# Patient Record
Sex: Male | Born: 2000
Health system: Southern US, Community
[De-identification: ages and names within clinical notes are randomized; demographics above are authoritative.]

## PROBLEM LIST (undated history)

## (undated) DIAGNOSIS — J45909 Unspecified asthma, uncomplicated: Secondary | ICD-10-CM

## (undated) HISTORY — DX: Unspecified asthma, uncomplicated: J45.909

---
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
## 2018-03-27 DIAGNOSIS — Z23 Encounter for immunization: Secondary | ICD-10-CM | POA: Diagnosis not present

## 2018-07-05 DIAGNOSIS — R6883 Chills (without fever): Secondary | ICD-10-CM | POA: Diagnosis not present

## 2018-07-05 DIAGNOSIS — M25561 Pain in right knee: Secondary | ICD-10-CM | POA: Diagnosis not present

## 2018-07-05 DIAGNOSIS — R040 Epistaxis: Secondary | ICD-10-CM | POA: Diagnosis not present

## 2018-07-05 DIAGNOSIS — R110 Nausea: Secondary | ICD-10-CM | POA: Diagnosis not present

## 2018-07-05 DIAGNOSIS — K529 Noninfective gastroenteritis and colitis, unspecified: Secondary | ICD-10-CM | POA: Diagnosis not present

## 2018-07-07 ENCOUNTER — Emergency Department (HOSPITAL_COMMUNITY): Payer: 59

## 2018-07-07 ENCOUNTER — Encounter (HOSPITAL_COMMUNITY): Payer: Self-pay | Admitting: Emergency Medicine

## 2018-07-07 ENCOUNTER — Emergency Department (HOSPITAL_COMMUNITY)
Admission: EM | Admit: 2018-07-07 | Discharge: 2018-07-07 | Disposition: A | Discharge status: Home / Self Care | Payer: 59 | Attending: Emergency Medicine | Admitting: Emergency Medicine

## 2018-07-07 ENCOUNTER — Other Ambulatory Visit: Payer: Self-pay

## 2018-07-07 DIAGNOSIS — J45909 Unspecified asthma, uncomplicated: Secondary | ICD-10-CM | POA: Insufficient documentation

## 2018-07-07 DIAGNOSIS — R197 Diarrhea, unspecified: Secondary | ICD-10-CM | POA: Diagnosis not present

## 2018-07-07 DIAGNOSIS — K5900 Constipation, unspecified: Secondary | ICD-10-CM | POA: Diagnosis not present

## 2018-07-07 DIAGNOSIS — R42 Dizziness and giddiness: Secondary | ICD-10-CM | POA: Insufficient documentation

## 2018-07-07 LAB — COMPREHENSIVE METABOLIC PANEL
ALT: 22 U/L (ref 0–44)
ANION GAP: 9 (ref 5–15)
AST: 37 U/L (ref 15–41)
Albumin: 4.2 g/dL (ref 3.5–5.0)
Alkaline Phosphatase: 81 U/L (ref 38–126)
BUN: 14 mg/dL (ref 6–20)
CHLORIDE: 105 mmol/L (ref 98–111)
CO2: 25 mmol/L (ref 22–32)
Calcium: 8.8 mg/dL — ABNORMAL LOW (ref 8.9–10.3)
Creatinine, Ser: 0.78 mg/dL (ref 0.61–1.24)
GFR calc Af Amer: >60 mL/min (ref >60)
GFR calc non Af Amer: >60 mL/min (ref >60)
Glucose, Bld: 121 mg/dL — ABNORMAL HIGH (ref 70–99)
Potassium: 4.1 mmol/L (ref 3.5–5.1)
Sodium: 139 mmol/L (ref 135–145)
Total Bilirubin: 0.8 mg/dL (ref 0.3–1.2)
Total Protein: 7.7 g/dL (ref 6.5–8.1)

## 2018-07-07 LAB — URINALYSIS, ROUTINE W REFLEX MICROSCOPIC
Bilirubin Urine: NEGATIVE
Glucose, UA: NEGATIVE mg/dL
Hgb urine dipstick: NEGATIVE
Ketones, ur: NEGATIVE mg/dL
Leukocytes,Ua: NEGATIVE
Nitrite: NEGATIVE
PH: 6 (ref 5.0–8.0)
Protein, ur: NEGATIVE mg/dL
Specific Gravity, Urine: 1.021 (ref 1.005–1.030)

## 2018-07-07 LAB — LIPASE, BLOOD: Lipase: 31 U/L (ref 11–51)

## 2018-07-07 LAB — CBC
HCT: 45.9 % (ref 39.0–52.0)
Hemoglobin: 14.9 g/dL (ref 13.0–17.0)
MCH: 31 pg (ref 26.0–34.0)
MCHC: 32.5 g/dL (ref 30.0–36.0)
MCV: 95.4 fL (ref 80.0–100.0)
Platelets: 181 10*3/uL (ref 150–400)
RBC: 4.81 MIL/uL (ref 4.22–5.81)
RDW: 11.8 % (ref 11.5–15.5)
WBC: 7 10*3/uL (ref 4.0–10.5)
nRBC: 0 % (ref 0.0–0.2)

## 2018-07-07 NOTE — ED Provider Notes (Signed)
[facility] EMERGENCY DEPARTMENT Provider Note   CSN: [id number] Arrival date & time: 07/07/18  1616    History   Chief Complaint Chief Complaint  Patient presents with  . Diarrhea    HPI Randy Armstrong is a 18 y.o. male.     Dizziness, diarrhea for several days.  Patient was seen at a local urgent care center on Thursday.  Flu swab at that time was negative.  Apparently his blood pressure was on the low side.  He is normally healthy.  No fever, sweats, chills, neuro deficits, chest pain, dyspnea.  He was prescribed Antivert for his dizziness.  Social history: Consulting civil engineer at [employer].      History reviewed. No pertinent past medical history.  Patient Active Problem List   Diagnosis Date Noted  . FEVER, RECURRENT 02/16/2009  . ASTHMA 11/13/2008  . ANKLE SPRAIN, RIGHT 11/13/2008    History reviewed. No pertinent surgical history.      Home Medications    Prior to Admission medications   Medication Sig Start Date End Date Taking? Authorizing Provider  meclizine (ANTIVERT) 12.5 MG tablet Take 1 tablet by mouth 3 (three) times daily. 07/05/18 07/10/18 Yes [provider]  Multiple Vitamins-Minerals (MULTIVITAMIN ADULTS) TABS Take 1 tablet by mouth daily.   Yes [provider]  predniSONE (DELTASONE) 10 MG tablet Take 1 tablet by mouth 2 (two) times daily. 07/05/18 07/10/18 Yes [provider]  trimethoprim-polymyxin b (POLYTRIM) ophthalmic solution 2 drop in affected eye(s) every 4 hours (while awake) x 7 days 04/03/14  Yes Lajean Manes, MD    Family History History reviewed. No pertinent family history.  Social History Social History   Tobacco Use  . Smoking status: Never Smoker  Substance Use Topics  . Alcohol use: No  . Drug use: Not Currently     Allergies   Diphenhydramine hcl   Review of Systems Review of Systems  All other systems reviewed and are negative.    Physical Exam Updated Vital Signs BP 107/82   Pulse 62   Temp  (!) 97.1 F (36.2 C) (Temporal)   Resp 18   Ht 6\' 3"  (1.905 m)   Wt 79.4 kg   SpO2 94%   BMI 21.87 kg/m   Physical Exam Vitals signs and nursing note reviewed.  Constitutional:      Appearance: He is well-developed.  HENT:     Head: Normocephalic and atraumatic.  Eyes:     Conjunctiva/sclera: Conjunctivae normal.  Neck:     Musculoskeletal: Neck supple.  Cardiovascular:     Rate and Rhythm: Normal rate and regular rhythm.  Pulmonary:     Effort: Pulmonary effort is normal.     Breath sounds: Normal breath sounds.  Abdominal:     General: Bowel sounds are normal.     Palpations: Abdomen is soft.  Musculoskeletal: Normal range of motion.  Skin:    General: Skin is warm and dry.  Neurological:     Mental Status: He is alert and oriented to person, place, and time.  Psychiatric:        Behavior: Behavior normal.      ED Treatments / Results  Labs (all labs ordered are listed, but only abnormal results are displayed) Labs Reviewed  COMPREHENSIVE METABOLIC PANEL - Abnormal; Notable for the following components:      Result Value   Glucose, Bld 121 (*)    Calcium 8.8 (*)    All other components within normal limits  LIPASE, BLOOD  CBC  URINALYSIS, ROUTINE W REFLEX MICROSCOPIC    EKG None  Radiology Dg Abdomen 1 View  Result Date: 07/07/2018 CLINICAL DATA:  Constipation.  Flu-like symptoms. EXAM: ABDOMEN - 1 VIEW COMPARISON:  CT 09/19/2011 FINDINGS: There is a non obstructive bowel gas pattern. No supine evidence of free air. No organomegaly or suspicious calcification. No acute bony abnormality. No significant increase in stool burden. IMPRESSION: Negative. Electronically Signed   By: Charlett Nose M.D.   On: 07/07/2018 19:20    Procedures Procedures (including critical care time)  Medications Ordered in ED Medications - No data to display   Initial Impression / Assessment and Plan / ED Course  I have reviewed the triage vital signs and the nursing  notes.  Pertinent labs & imaging results that were available during my care of the patient were reviewed by me and considered in my medical decision making (see chart for details).        Patient presents with dizziness and diarrhea.  Physical exam normal.  Vital signs acceptable.  Screening labs reveal glucose of 121.  Urinalysis and KUB normal.  Fluids, Antivert 25 mg as needed for dizziness.  Final Clinical Impressions(s) / ED Diagnoses   Final diagnoses:  Dizziness    ED Discharge Orders    None       Donnetta Hutching, MD 07/07/18 [phone]

## 2018-07-07 NOTE — ED Triage Notes (Signed)
Pt was seen at urgent care for flu-like sx, flu swab was negative. Sent her d/t low BP and D/ since Tuesday.

## 2018-07-07 NOTE — Discharge Instructions (Addendum)
Your glucose or sugar is slightly elevated at 121.  This will need to be rechecked.  Otherwise tests were good.  Recommend taking the medication for dizziness.  Increase fluids.  Rest.

## 2018-07-20 DIAGNOSIS — J309 Allergic rhinitis, unspecified: Secondary | ICD-10-CM | POA: Diagnosis not present

## 2018-08-17 DIAGNOSIS — R0781 Pleurodynia: Secondary | ICD-10-CM | POA: Diagnosis not present

## 2018-08-17 DIAGNOSIS — S30811A Abrasion of abdominal wall, initial encounter: Secondary | ICD-10-CM | POA: Diagnosis not present

## 2018-08-17 DIAGNOSIS — Z23 Encounter for immunization: Secondary | ICD-10-CM | POA: Diagnosis not present

## 2018-08-17 DIAGNOSIS — S301XXA Contusion of abdominal wall, initial encounter: Secondary | ICD-10-CM | POA: Diagnosis not present

## 2018-08-17 DIAGNOSIS — X500XXA Overexertion from strenuous movement or load, initial encounter: Secondary | ICD-10-CM | POA: Diagnosis not present

## 2019-03-03 DIAGNOSIS — G44209 Tension-type headache, unspecified, not intractable: Secondary | ICD-10-CM | POA: Diagnosis not present

## 2019-03-03 DIAGNOSIS — J020 Streptococcal pharyngitis: Secondary | ICD-10-CM | POA: Diagnosis not present

## 2019-03-03 DIAGNOSIS — J029 Acute pharyngitis, unspecified: Secondary | ICD-10-CM | POA: Diagnosis not present

## 2019-03-26 ENCOUNTER — Ambulatory Visit: Payer: 59 | Admitting: Family Medicine

## 2019-04-22 ENCOUNTER — Other Ambulatory Visit: Payer: Self-pay

## 2019-04-22 ENCOUNTER — Encounter: Payer: Self-pay | Admitting: Family Medicine

## 2019-04-22 ENCOUNTER — Ambulatory Visit: Payer: 59 | Admitting: Family Medicine

## 2019-04-22 VITALS — BP 118/78 | HR 87 | Temp 98.3°F | Ht 75.0 in | Wt 175.0 lb

## 2019-04-22 DIAGNOSIS — Z23 Encounter for immunization: Secondary | ICD-10-CM | POA: Diagnosis not present

## 2019-04-22 DIAGNOSIS — J301 Allergic rhinitis due to pollen: Secondary | ICD-10-CM | POA: Diagnosis not present

## 2019-04-22 DIAGNOSIS — Z0000 Encounter for general adult medical examination without abnormal findings: Secondary | ICD-10-CM | POA: Diagnosis not present

## 2019-04-22 DIAGNOSIS — L700 Acne vulgaris: Secondary | ICD-10-CM

## 2019-04-22 DIAGNOSIS — J4520 Mild intermittent asthma, uncomplicated: Secondary | ICD-10-CM | POA: Insufficient documentation

## 2019-04-22 HISTORY — DX: Allergic rhinitis due to pollen: J30.1

## 2019-04-22 MED ORDER — ALBUTEROL SULFATE HFA 108 (90 BASE) MCG/ACT IN AERS: 2.0000 | INHALATION_SPRAY | RESPIRATORY_TRACT | 0 refills | Status: DC | PRN

## 2019-04-22 MED ORDER — DOXYCYCLINE HYCLATE 100 MG PO TABS: 100.0000 mg | ORAL_TABLET | Freq: Two times a day (BID) | ORAL | 0 refills | Status: AC

## 2019-04-22 MED ORDER — ADAPALENE-BENZOYL PEROXIDE 0.1-2.5 % EX GEL: CUTANEOUS | 5 refills | Status: DC

## 2019-04-22 NOTE — Progress Notes (Signed)
Subjective  Chief Complaint  Patient presents with  . Establish Care  . Annual Exam  . Acne   HPI: Randy Armstrong is a 18 y.o. male who presents to [facility] [place] today for a Male Wellness Visit.   Wellness Visit: annual visit with health maintenance review and exam , establish care visit. I reviewed old records and chart. imm record is up to date.    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 Happy and healthy. Lives in [place] with his parents. 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 No HR behaviors identified. Hobbies: rollerskating.social: not dating, good friends. Nonsmoker   H/o childhood asthma. Rare exacerbation with URIs. Never hospitalized. No exercise induced sxs.   Worsening acne; describes pustular acne with red painful sores; breakouts are every 2-3 weeks although has chronic sxs. Uses ceravue face wash and benzoyl peroxide to keep it controlled but still bothersome. Mainly in temporal areas and forehead. None on torso.   Lifestyle: Body mass index is 21.88 kg/m. Wt Readings from Last 3 Encounters:  04/22/19 175 lb 0.7 oz (79.4 kg)  07/07/18 175 lb (79.4 kg) (76 %, Z= 0.71)*  04/03/14 144 lb (65.3 kg) (71 %, Z= 0.55)*   * Growth percentiles are based on CDC (Boys, 2-20 Years) data.    Patient Active Problem List   Diagnosis Date Noted  . Seasonal allergic rhinitis due to pollen 04/22/2019  . Childhood asthma, mild intermittent, uncomplicated [birth year]02[birth year]711  . ASTHMA 11/13/2008   Health Maintenance  Topic Date Due  . HIV Screening  08/31/2013  . TETANUS/TDAP  12/24/2019  . INFLUENZA VACCINE  Completed   Immunization History  Administered Date(s) Administered  . DTaP 11/04/1998, 01/04/1999, 03/09/1999, 12/02/1999, 11/13/2003  . HPV 9-valent 08/25/2016, 03/27/2018  . Hepatitis A, Ped/Adol-2 Dose 05/18/2016  . Hepatitis B, ped/adol 11/04/1998, 01/04/1999, 06/08/1999  . HiB  (PRP-OMP) 11/04/1998, 01/04/1999, 03/09/1999, 12/02/1999  . IPV 11/04/1998, 01/04/1999, 12/02/1999, 11/13/2003  . Influenza, Seasonal, Injecte, Preservative Fre 05/18/2016  . Influenza,inj,Quad PF,6+ Mos 04/22/2019  . Influenza-Unspecified 03/27/2018, 03/27/2018  . MMR 09/07/1999, 11/13/2003  . Meningococcal Conjugate 12/23/2009  . Pneumococcal-Unspecified 09/07/1999, 12/02/1999  . Td 12/23/2009  . Tdap 09/07/1999, 12/23/2009  . Varicella 09/07/1999, 12/23/2009   We updated and reviewed the patient's past history in detail and it is documented below. Allergies: Patient is allergic to diphenhydramine hcl. Past Medical History  has a past medical history of Asthma and Seasonal allergic rhinitis due to pollen (04/22/2019). Past Surgical History  has no past surgical history on file. Social History Patient  reports that he has never smoked. He has never used smokeless tobacco. He reports previous drug use. He reports that he does not drink alcohol. Family History Patient family history includes Arthritis in his maternal grandmother and paternal grandmother; Cancer in his mother; Hypertension in his maternal grandmother and paternal grandmother. Review of Systems: Constitutional: negative for fever or malaise Ophthalmic: negative for photophobia, double vision or loss of vision Cardiovascular: negative for chest pain, dyspnea on exertion, or new LE swelling Respiratory: negative for SOB or persistent cough Gastrointestinal: negative for abdominal pain, change in bowel habits or melena Genitourinary: negative for dysuria or gross hematuria Musculoskeletal: negative for new gait disturbance or muscular weakness Integumentary: negative for new or persistent rashes, no breast lumps Neurological: negative for TIA or stroke symptoms Psychiatric: negative for SI or delusions Allergic/Immunologic: negative for hives  Patient Care Team  Relationship Specialty Notifications Start End  Leamon Arnt, MD PCP - General Family Medicine  04/22/19    Objective  Vitals: BP 118/78 (BP Location: Left Arm, Patient Position: Sitting, Cuff Size: Normal)   Pulse 87   Temp 98.3 F (36.8 C) (Temporal)   Ht 6' 3"  (1.905 m)   Wt 175 lb 0.7 oz (79.4 kg)   SpO2 97%   BMI 21.88 kg/m  General:  Well developed, well nourished, no acute distress  Psych:  Alert and orientedx3,normal mood and affect HEENT:  Normocephalic, atraumatic, non-icteric sclera, PERRL, oropharynx is clear without mass or exudate, supple neck without adenopathy, mass or thyromegaly Cardiovascular:  Normal S1, S2, RRR without gallop, rub or murmur, nondisplaced PMI, +2 distal pulses in bilateral upper and lower extremities. Respiratory:  Good breath sounds bilaterally, CTAB with normal respiratory effort Gastrointestinal: normal bowel sounds, soft, non-tender, no noted masses. No HSM MSK: no deformities, contusions. Joints are without erythema or swelling. Spine and CVA region are nontender Skin:  Warm, no rashes or suspicious lesions noted, pustular acne on face Neurologic:    Mental status is normal. CN 2-11 are normal. Gross motor and sensory exams are normal. Stable gait. No tremor GU: No inguinal hernias or adenopathy are appreciated bilaterally  Assessment  1. Annual physical exam   2. Seasonal allergic rhinitis due to pollen   3. Childhood asthma, mild intermittent, uncomplicated   4. Pustular acne   5. Need for immunization against influenza      Plan  Male Wellness Visit:  Age appropriate Health Maintenance and Prevention measures were discussed with patient. Included topics are cancer screening recommendations, ways to keep healthy (see AVS) including dietary and exercise recommendations, regular eye and dental care, use of seat belts, and avoidance of moderate alcohol use and tobacco use.   BMI: discussed patient's BMI and encouraged positive lifestyle modifications to help get to or maintain a target BMI.   HM needs and immunizations were addressed and ordered. See below for orders. See HM and immunization section for updates.  Routine labs and screening tests ordered including cmp, cbc and lipids where appropriate.  Discussed recommendations regarding Vit D and calcium supplementation (see AVS)  Acne: start epiduo and single course of doxy. F/u if not improving. Discussed skin care.  H/o ashthma, now more WARI: discussed prn albuterol. F/u if needed. Flu shot today.  Follow up: Return in about 1 year (around 04/21/2020) for complete physical.   Commons side effects, risks, benefits, and alternatives for medications and treatment plan prescribed today were discussed, and the patient expressed understanding of the given instructions. Patient is instructed to call or message via MyChart if he/she has any questions or concerns regarding our treatment plan. No barriers to understanding were identified. We discussed Red Flag symptoms and signs in detail. Patient expressed understanding regarding what to do in case of urgent or emergency type symptoms.   Medication list was reconciled, printed and provided to the patient in AVS. Patient instructions and summary information was reviewed with the patient as documented in the AVS. This note was prepared with assistance of Dragon voice recognition software. Occasional wrong-word or sound-a-like substitutions may have occurred due to the inherent limitations of voice recognition software This visit occurred during the SARS-CoV-2 public health emergency.  Safety protocols were in place, including screening questions prior to the visit, additional usage of staff PPE, and extensive cleaning of exam room while observing appropriate contact time as indicated for disinfecting solutions.  Orders Placed This Encounter  Procedures  . Flu Vaccine QUAD 36+ mos IM   Meds ordered this encounter  Medications  . Adapalene-Benzoyl Peroxide 0.1-2.5 % gel    Sig: Apply  to face nightly    Dispense:  45 g    Refill:  5  . doxycycline (VIBRA-TABS) 100 MG tablet    Sig: Take 1 tablet (100 mg total) by mouth 2 (two) times daily for 7 days.    Dispense:  14 tablet    Refill:  0  . albuterol (VENTOLIN HFA) 108 (90 Base) MCG/ACT inhaler    Sig: Inhale 2 puffs into the lungs every 4 (four) hours as needed for wheezing or shortness of breath.    Dispense:  18 g    Refill:  0

## 2019-04-22 NOTE — Patient Instructions (Signed)
Please return in 12 months for your annual complete physical; please come fasting. Sooner if your acne is not improving.   Today you were given your flu vaccination.    It was a pleasure meeting you today! Thank you for choosing [place] to meet your healthcare needs! I truly look forward to working with you. If you have any questions or concerns, please send me a message via Mychart or call the office at [phone].   Acne  Acne is a skin problem that causes pimples and other skin changes. The skin has many tiny openings called pores. Each pore contains an oil gland. Oil glands make an oily substance that is called sebum. Acne occurs when the pores in the skin get blocked. The pores may become infected with bacteria, or they may become red, sore, and swollen. Acne is a common skin problem, especially for teenagers. It often occurs on the face, neck, chest, upper arms, and back. Acne usually goes away over time. What are the causes? Acne is caused when oil glands get blocked with sebum, dead skin cells, and dirt. The bacteria that are normally found in the oil glands then multiply and cause inflammation. Acne is commonly triggered by changes in your hormones. These hormonal changes can cause the oil glands to get bigger and to make more sebum. Factors that can make acne worse include:  Hormone changes during: ? Adolescence. ? Women's menstrual cycles. ? Pregnancy.  Oil-based cosmetics and hair products.  Stress.  Hormone problems that are caused by certain diseases.  Certain medicines.  Pressure from headbands, backpacks, or shoulder pads.  Exposure to certain oils and chemicals.  Eating a diet high in carbohydrates that quickly turn to sugar. These include dairy products, desserts, and chocolates. What increases the risk? This condition is more likely to develop in:  Teenagers.  People who have a family history of acne. What are the signs or symptoms? Symptoms include:  Small,  red bumps (pimples or papules).  Whiteheads.  Blackheads.  Small, pus-filled pimples (pustules).  Big, red pimples or pustules that feel tender. More severe acne can cause:  An abscess. This is an infected area that contains a collection of pus.  Cysts. These are hard, painful, fluid-filled sacs.  Scars. These can happen after large pimples heal. How is this diagnosed? This condition is diagnosed with a medical history and physical exam. Blood tests may also be done. How is this treated? Treatment for this condition can vary depending on the severity of your acne. Treatment may include:  Creams and lotions that prevent oil glands from clogging.  Creams and lotions that treat or prevent infections and inflammation.  Antibiotic medicines that are applied to the skin or taken as a pill.  Pills that decrease sebum production.  Birth control pills.  Light or laser treatments.  Injections of medicine into the affected areas.  Chemicals that cause peeling of the skin.  Surgery. Your health care provider will also recommend the best way to take care of your skin. Good skin care is the most important part of treatment. Follow these instructions at home: Skin care Take care of your skin as told by your health care provider. You may be told to do these things:  Wash your skin gently at least two times each day, as well as: ? After you exercise. ? Before you go to bed.  Use mild soap.  Apply a water-based skin moisturizer after you wash your skin.  Use a sunscreen or  sunblock with SPF 30 or greater. This is especially important if you are using acne medicines.  Choose cosmetics that will not block your oil glands (are noncomedogenic). Medicines  Take over-the-counter and prescription medicines only as told by your health care provider.  If you were prescribed an antibiotic medicine, apply it or take it as told by your health care provider. Do not stop using the  antibiotic even if your condition improves. General instructions  Keep your hair clean and off your face. If you have oily hair, shampoo your hair regularly or daily.  Avoid wearing tight headbands or hats.  Avoid picking or squeezing your pimples. That can make your acne worse and cause scarring.  Shave gently and only when necessary.  Keep a food journal to figure out if any foods are linked to your acne. Avoid dairy products, desserts, and chocolates.  Take steps to manage and reduce stress.  Keep all follow-up visits as told by your health care provider. This is important. Contact a health care provider if:  Your acne is not better after eight weeks.  Your acne gets worse.  You have a large area of skin that is red or tender.  You think that you are having side effects from any acne medicine. Summary  Acne is a skin problem that causes pimples and other skin changes. Acne is a common skin problem, especially for teenagers. Acne usually goes away over time.  Acne is commonly triggered by changes in your hormones. There are many other causes, such as stress, diet, and certain medicines.  Follow your health care provider's instructions for how to take care of your skin. Good skin care is the most important part of treatment.  Take over-the-counter and prescription medicines only as told by your health care provider.  Contact your health care provider if you think that you are having side effects from any acne medicine. This information is not intended to replace advice given to you by your health care provider. Make sure you discuss any questions you have with your health care provider. Document Released: 04/29/2000 Document Revised: 09/12/2017 Document Reviewed: 09/12/2017 Elsevier Patient Education  2020 [employer].

## 2019-05-16 ENCOUNTER — Encounter: Payer: Self-pay | Admitting: Family Medicine

## 2019-05-16 ENCOUNTER — Other Ambulatory Visit: Payer: Self-pay | Admitting: Family Medicine

## 2019-05-20 NOTE — Telephone Encounter (Signed)
Please advise 

## 2019-05-20 NOTE — Telephone Encounter (Signed)
  LAST APPOINTMENT DATE: 04/21/2020  NEXT APPOINTMENT DATE:04/22/2020  MEDICATION: Doxycycline 100 MG Tabs  PHARMACY: [facility]- [place], [place]- [address]   LAST REFILL: 04/22/2019  QTY: 14  REFILLS: 0    OTHER COMMENTS:    Okay for refill?  Please advise

## 2019-05-22 DIAGNOSIS — Z20828 Contact with and (suspected) exposure to other viral communicable diseases: Secondary | ICD-10-CM | POA: Diagnosis not present

## 2019-07-03 ENCOUNTER — Encounter: Payer: Self-pay | Admitting: Family Medicine

## 2019-07-03 ENCOUNTER — Ambulatory Visit (INDEPENDENT_AMBULATORY_CARE_PROVIDER_SITE_OTHER): Payer: 59 | Admitting: Family Medicine

## 2019-07-03 ENCOUNTER — Other Ambulatory Visit: Payer: Self-pay

## 2019-07-03 VITALS — BP 110/62 | HR 77 | Temp 97.5°F | Ht 75.0 in | Wt 181.2 lb

## 2019-07-03 DIAGNOSIS — L700 Acne vulgaris: Secondary | ICD-10-CM | POA: Diagnosis not present

## 2019-07-03 MED ORDER — DOXYCYCLINE HYCLATE 100 MG PO TABS: 100.0000 mg | ORAL_TABLET | Freq: Two times a day (BID) | ORAL | 2 refills | Status: AC

## 2019-07-03 NOTE — Progress Notes (Signed)
Subjective  CC:  Chief Complaint  Patient presents with  . Acne    abnormally large acne bump on right side of forehead. about a week. bump is tender and increasing in size    HPI: Randy Armstrong is a 19 y.o. male who presents to the office today to address the problems listed above in the chief complaint.  In December started on epiduo after one course of doxy for pustular acne. Reports improvement. Since, his acne has been well controlled until 2 days ago: now with large tender pustule on right temple. No associated fevers or drainage. Remainder of skin is fairly clear.   Assessment  1. Pustular acne      Plan   acne:  Doxy x 7 days. Will treat as needed for flares. IF becomes more frequent, then would suppress with daily minocin. Continue epiduo.   Follow up: prn  Visit date not found  No orders of the defined types were placed in this encounter.  No orders of the defined types were placed in this encounter.     I reviewed the patients updated PMH, FH, and SocHx.    Patient Active Problem List   Diagnosis Date Noted  . Pustular acne 07/03/2019  . Seasonal allergic rhinitis due to pollen 04/22/2019  . Childhood asthma, mild intermittent, uncomplicated 04/22/2019  . ASTHMA 11/13/2008   Current Meds  Medication Sig  . Adapalene-Benzoyl Peroxide 0.1-2.5 % gel Apply to face nightly  . albuterol (VENTOLIN HFA) 108 (90 Base) MCG/ACT inhaler Inhale 2 puffs into the lungs every 4 (four) hours as needed for wheezing or shortness of breath.  . Multiple Vitamins-Minerals (MULTIVITAMIN ADULTS) TABS Take 1 tablet by mouth daily.    Allergies: Patient is allergic to diphenhydramine hcl. Family History: Patient family history includes Arthritis in his maternal grandmother and paternal grandmother; Cancer in his mother; Hypertension in his maternal grandmother and paternal grandmother. Social History:  Patient  reports that he has never smoked. He has never used smokeless  tobacco. He reports previous drug use. He reports that he does not drink alcohol.  Review of Systems: Constitutional: Negative for fever malaise or anorexia Cardiovascular: negative for chest pain Respiratory: negative for SOB or persistent cough Gastrointestinal: negative for abdominal pain  Objective  Vitals: BP 110/62 (BP Location: Left Arm, Patient Position: Sitting, Cuff Size: Normal)   Pulse 77   Temp (!) 97.5 F (36.4 C) (Temporal)   Ht 6\' 3"  (1.905 m)   Wt 181 lb 3.2 oz (82.2 kg)   SpO2 96%   BMI 22.65 kg/m  General: no acute distress , A&Ox3 Skin: right temple with pustule, approx 1 inch    Commons side effects, risks, benefits, and alternatives for medications and treatment plan prescribed today were discussed, and the patient expressed understanding of the given instructions. Patient is instructed to call or message via MyChart if he/she has any questions or concerns regarding our treatment plan. No barriers to understanding were identified. We discussed Red Flag symptoms and signs in detail. Patient expressed understanding regarding what to do in case of urgent or emergency type symptoms.   Medication list was reconciled, printed and provided to the patient in AVS. Patient instructions and summary information was reviewed with the patient as documented in the AVS. This note was prepared with assistance of Dragon voice recognition software. Occasional wrong-word or sound-a-like substitutions may have occurred due to the inherent limitations of voice recognition software  This visit occurred during the SARS-CoV-2 public  health emergency.  Safety protocols were in place, including screening questions prior to the visit, additional usage of staff PPE, and extensive cleaning of exam room while observing appropriate contact time as indicated for disinfecting solutions.

## 2019-07-03 NOTE — Patient Instructions (Signed)
Please follow up if symptoms do not improve or as needed.   Take the antibiotic for 7 days and use advil as needed for pain or soreness.  IF you get another outbreak, you may refill the doxycycline as needed. However, if you are getting multiple flares, then call me.

## 2019-07-27 ENCOUNTER — Ambulatory Visit: Payer: 59 | Attending: Internal Medicine

## 2019-07-27 DIAGNOSIS — Z23 Encounter for immunization: Secondary | ICD-10-CM

## 2019-07-27 DIAGNOSIS — R0602 Shortness of breath: Secondary | ICD-10-CM | POA: Diagnosis not present

## 2019-07-27 DIAGNOSIS — R61 Generalized hyperhidrosis: Secondary | ICD-10-CM | POA: Diagnosis not present

## 2019-07-27 DIAGNOSIS — R069 Unspecified abnormalities of breathing: Secondary | ICD-10-CM | POA: Diagnosis not present

## 2019-07-27 NOTE — Progress Notes (Signed)
   Covid-19 Vaccination Clinic  Name:  Randy Armstrong    MRN: [id number] DOB: 06/29/2000  07/27/2019  Mr. Malina was observed post Covid-19 immunization for approximately 5 minutes when he began to experience dizziness.  He was supported by staff to avoid fall and VS were taken with BP 95/41 and pulse 49  EMS was notified and evaluated patient - at that time BP was over 100 systolic (patient stated he usually runs a lower BP according to his MD)  With pulse increased to 60s-70s.  No further dizziness or diaphoresis.  Ambulated with EMS staff without difficulty. Patient declined transport to hospital.  Total time approximately 35 minutes.  Ambulated on DC without difficulty. He was provided with Vaccine Information Sheet and instruction to access the V-Safe system.   Mr. Daws was instructed to call 911 with any severe reactions post vaccine: Marland Kitchen Difficulty breathing  . Swelling of face and throat  . A fast heartbeat  . A bad rash all over body  . Dizziness and weakness   Immunizations Administered    Name Date Dose VIS Date Route   Moderna COVID-19 Vaccine 07/27/2019 10:36 AM 0.5 mL 04/16/2019 Intramuscular   Manufacturer: Moderna   Lot: [id number]   NDC: 94446-190-12

## 2019-08-28 ENCOUNTER — Ambulatory Visit: Payer: 59 | Attending: Internal Medicine

## 2019-08-28 DIAGNOSIS — Z23 Encounter for immunization: Secondary | ICD-10-CM

## 2019-08-28 NOTE — Progress Notes (Signed)
   Covid-19 Vaccination Clinic  Name:  Randy Armstrong    MRN: [id number] DOB: July 13, 2000  08/28/2019  Randy Armstrong was observed post Covid-19 immunization for 30 minutes based on pre-vaccination screening without incident. He was provided with Vaccine Information Sheet and instruction to access the V-Safe system.   Randy Armstrong was instructed to call 911 with any severe reactions post vaccine: Marland Kitchen Difficulty breathing  . Swelling of face and throat  . A fast heartbeat  . A bad rash all over body  . Dizziness and weakness   Immunizations Administered    Name Date Dose VIS Date Route   Moderna COVID-19 Vaccine 08/28/2019 10:07 AM 0.5 mL 04/16/2019 Intramuscular   Manufacturer: Moderna   Lot: [id number]   NDC: 01561-537-94

## 2019-12-31 IMAGING — DX DG ABDOMEN 1V
2 series · 2 of 2 positions shown · non-contrast
Comparison: CT 09/19/2011

CLINICAL DATA: Constipation.  Flu-like symptoms.

EXAM:
ABDOMEN - 1 VIEW

[abdomen kub (1 of 2)]
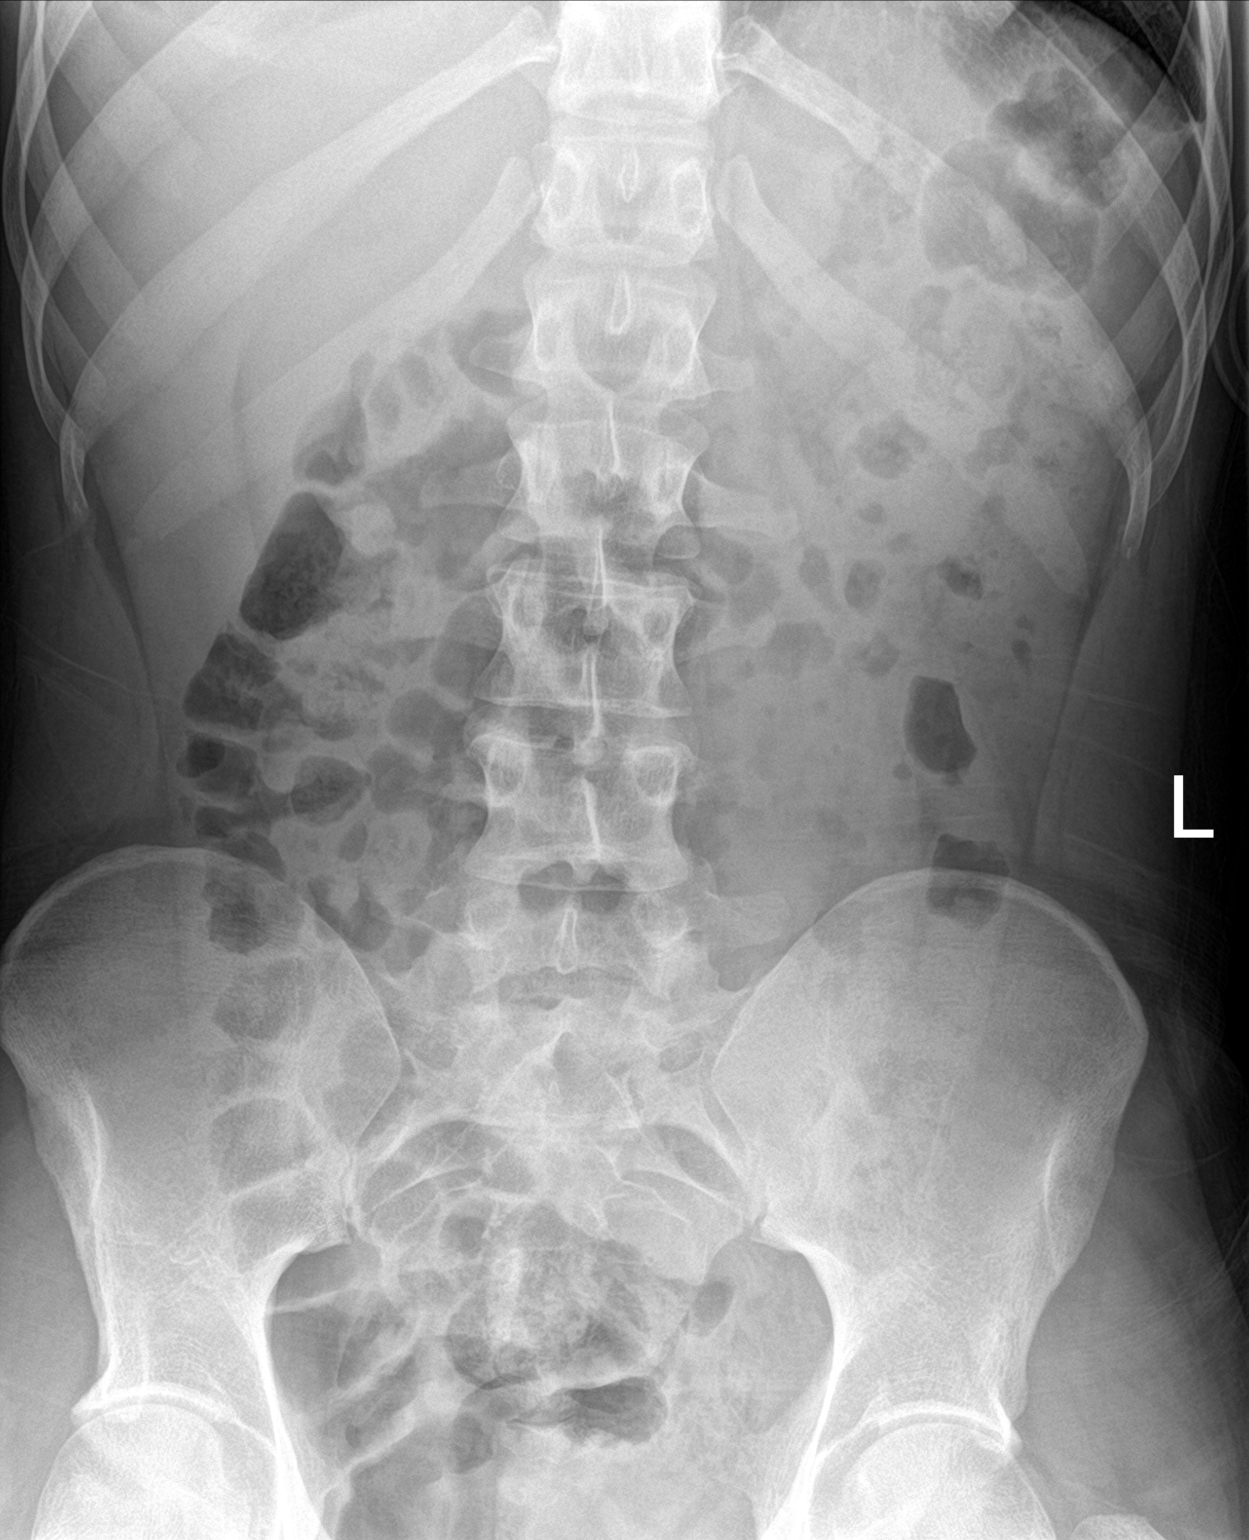

[abdomen kub (2 of 2)]
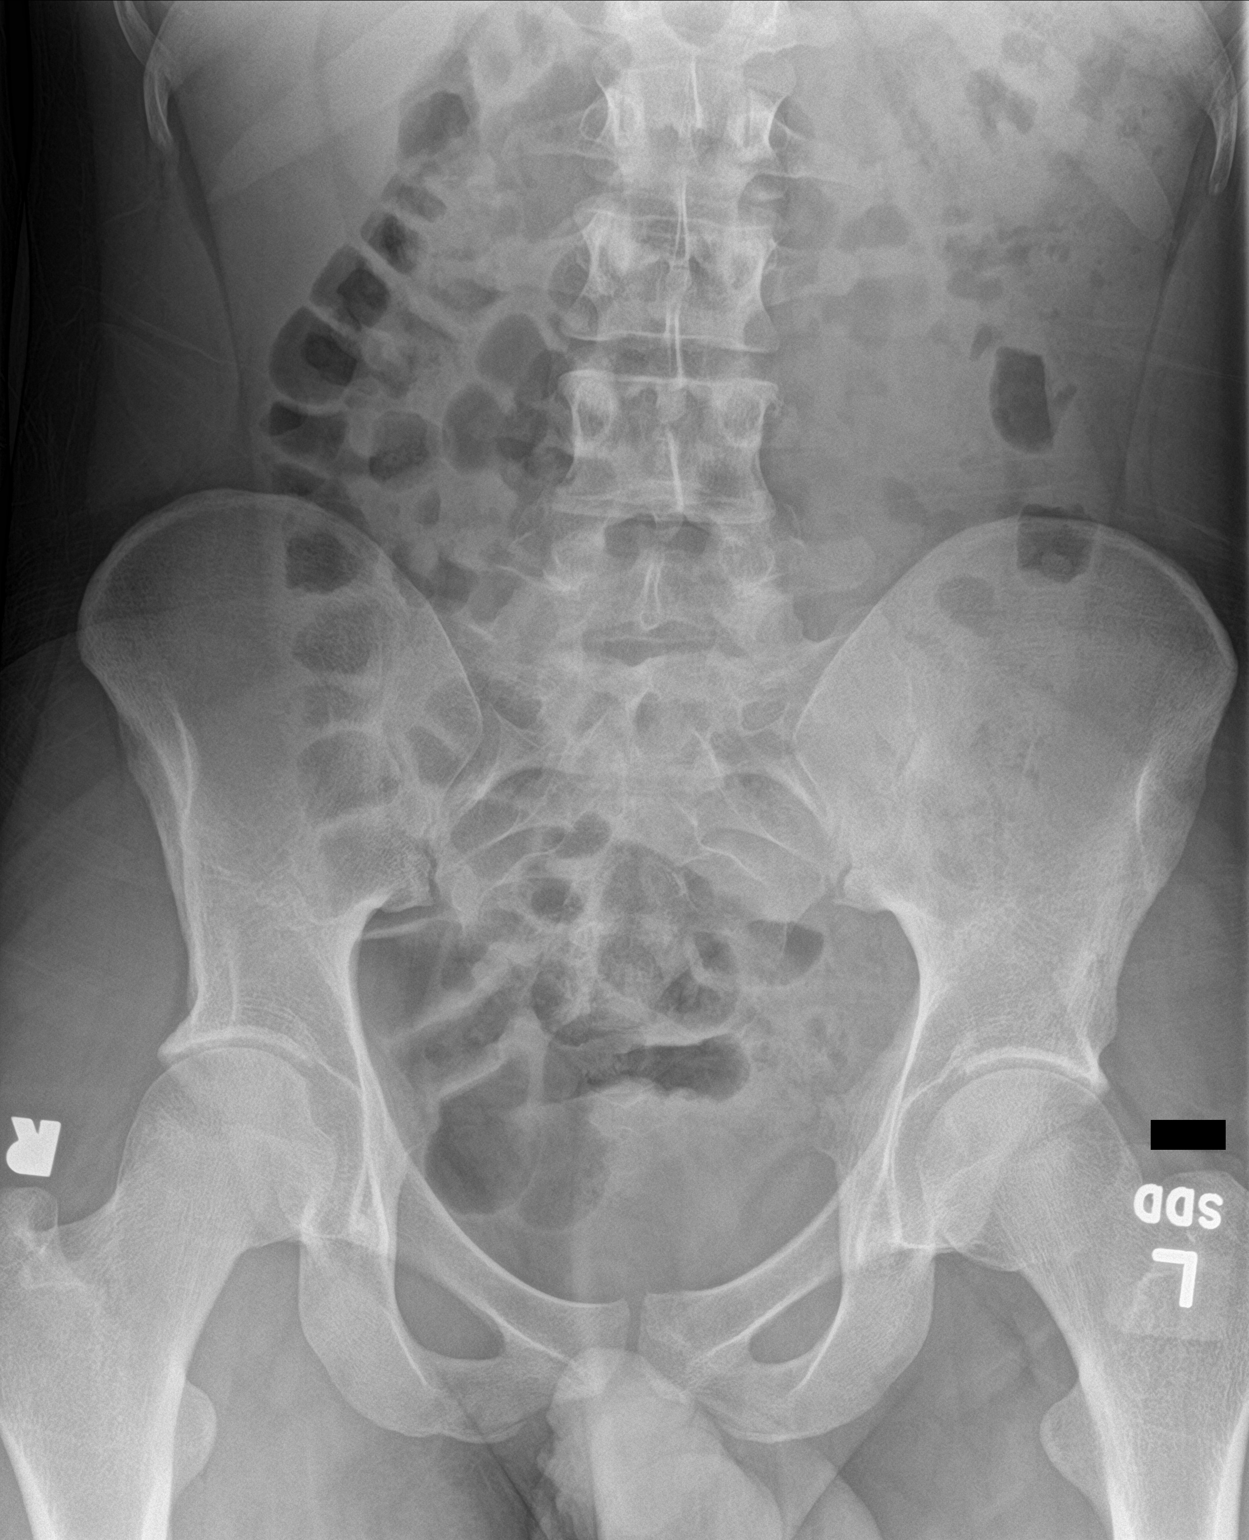

[2 of 2 positions shown; findings below may reference images not displayed]

FINDINGS: There is a non obstructive bowel gas pattern. No supine evidence of
free air. No organomegaly or suspicious calcification. No acute bony
abnormality. No significant increase in stool burden.
IMPRESSION: Negative.

## 2020-02-03 DIAGNOSIS — H52223 Regular astigmatism, bilateral: Secondary | ICD-10-CM | POA: Diagnosis not present

## 2020-02-03 DIAGNOSIS — H5213 Myopia, bilateral: Secondary | ICD-10-CM | POA: Diagnosis not present

## 2020-04-22 ENCOUNTER — Encounter: Payer: 59 | Admitting: Family Medicine

## 2021-02-05 ENCOUNTER — Emergency Department (HOSPITAL_COMMUNITY)
Admission: EM | Admit: 2021-02-05 | Discharge: 2021-02-05 | Disposition: A | Discharge status: Home / Self Care | Payer: 59 | Attending: Emergency Medicine | Admitting: Emergency Medicine

## 2021-02-05 ENCOUNTER — Encounter (HOSPITAL_COMMUNITY): Payer: Self-pay

## 2021-02-05 ENCOUNTER — Other Ambulatory Visit: Payer: Self-pay

## 2021-02-05 DIAGNOSIS — J45909 Unspecified asthma, uncomplicated: Secondary | ICD-10-CM | POA: Diagnosis not present

## 2021-02-05 DIAGNOSIS — R110 Nausea: Secondary | ICD-10-CM | POA: Diagnosis not present

## 2021-02-05 DIAGNOSIS — Z79899 Other long term (current) drug therapy: Secondary | ICD-10-CM | POA: Diagnosis not present

## 2021-02-05 DIAGNOSIS — L0231 Cutaneous abscess of buttock: Secondary | ICD-10-CM | POA: Insufficient documentation

## 2021-02-05 MED ORDER — OXYCODONE HCL 5 MG PO TABS: 5.0000 mg | ORAL_TABLET | Freq: Four times a day (QID) | ORAL | 0 refills | Status: DC | PRN

## 2021-02-05 MED ORDER — OXYCODONE HCL 5 MG PO TABS
5.0000 mg | ORAL_TABLET | Freq: Once | ORAL | Status: AC
  Administered 2021-02-05: 5 mg via ORAL
  Filled 2021-02-05: qty 1

## 2021-02-05 MED ORDER — LIDOCAINE-EPINEPHRINE (PF) 2 %-1:200000 IJ SOLN
10.0000 mL | Freq: Once | INTRAMUSCULAR | Status: AC
  Administered 2021-02-05: 10 mL
  Filled 2021-02-05: qty 20

## 2021-02-05 MED ORDER — CLINDAMYCIN HCL 300 MG PO CAPS: 300.0000 mg | ORAL_CAPSULE | Freq: Four times a day (QID) | ORAL | 0 refills | Status: AC

## 2021-02-05 MED ORDER — CLINDAMYCIN HCL 300 MG PO CAPS
300.0000 mg | ORAL_CAPSULE | Freq: Once | ORAL | Status: AC
  Administered 2021-02-05: 300 mg via ORAL
  Filled 2021-02-05: qty 1

## 2021-02-05 NOTE — Discharge Instructions (Addendum)
Continue antibiotics.  Take next dose tomorrow morning.  Continue warm water soaks and warm compresses to abscess site.  Packing will likely fall out on its own but you may remove it tomorrow night.  Expect more drainage from this site.  Continue to massage the area to help get more fluid out.  Please return if symptoms worsen.  Follow-up with your primary care doctor for wound recheck.

## 2021-02-05 NOTE — ED Triage Notes (Signed)
Pt reports abscess between buttocks for "several days". Denies drainage. Also reports a "low blood pressure sensation".

## 2021-02-05 NOTE — ED Provider Notes (Signed)
[facility]EMERGENCY DEPT Provider Note   CSN: [id number] Arrival date & time: 02/05/21  2118     History Chief Complaint  Patient presents with   Abscess    Randy Armstrong is a 20 y.o. male.  The history is provided by the patient.  Abscess Abscess location: left buttocks. Abscess quality: fluctuance and painful   Red streaking: no   Duration:  3 days Progression:  Worsening Pain details:    Quality:  Dull   Severity:  Mild Context: not diabetes and not immunosuppression   Relieved by:  Nothing Worsened by:  Nothing Associated symptoms: nausea   Associated symptoms: no anorexia, no fatigue, no fever, no headaches and no vomiting   Risk factors: no prior abscess       Past Medical History:  Diagnosis Date   Asthma    Seasonal allergic rhinitis due to pollen 04/22/2019    Patient Active Problem List   Diagnosis Date Noted   Pustular acne 07/03/2019   Seasonal allergic rhinitis due to pollen 04/22/2019   Childhood asthma, mild intermittent, uncomplicated 04/22/2019   ASTHMA 11/13/2008    History reviewed. No pertinent surgical history.     Family History  Problem Relation Age of Onset   Cancer Mother    Arthritis Maternal Grandmother    Hypertension Maternal Grandmother    Arthritis Paternal Grandmother    Hypertension Paternal Grandmother     Social History   Tobacco Use   Smoking status: Never   Smokeless tobacco: Never  Substance Use Topics   Alcohol use: No   Drug use: Not Currently    Home Medications Prior to Admission medications   Medication Sig Start Date End Date Taking? Authorizing Provider  clindamycin (CLEOCIN) 300 MG capsule Take 1 capsule (300 mg total) by mouth 4 (four) times daily for 10 days. 02/05/21 02/15/21 Yes Rio Kidane, DO  oxyCODONE (ROXICODONE) 5 MG immediate release tablet Take 1 tablet (5 mg total) by mouth every 6 (six) hours as needed for up to 10 doses for severe pain. 02/05/21  Yes  Idolina Mantell, DO  Adapalene-Benzoyl Peroxide 0.1-2.5 % gel Apply to face nightly 04/22/19   Willow Ora, MD  albuterol (VENTOLIN HFA) 108 (90 Base) MCG/ACT inhaler Inhale 2 puffs into the lungs every 4 (four) hours as needed for wheezing or shortness of breath. 04/22/19   Willow Ora, MD  Multiple Vitamins-Minerals (MULTIVITAMIN ADULTS) TABS Take 1 tablet by mouth daily.    [provider]    Allergies    Diphenhydramine hcl  Review of Systems   Review of Systems  Constitutional:  Negative for fatigue and fever.  Gastrointestinal:  Positive for nausea and rectal pain. Negative for abdominal pain, anal bleeding, anorexia, blood in stool and vomiting.  Skin:  Positive for color change.  Neurological:  Negative for weakness and headaches.   Physical Exam Updated Vital Signs  ED Triage Vitals  Enc Vitals Group     BP 02/05/21 2127 110/62     Pulse Rate 02/05/21 2127 62     Resp 02/05/21 2127 18     Temp 02/05/21 2127 98.3 F (36.8 C)     Temp Source 02/05/21 2127 Oral     SpO2 02/05/21 2127 96 %     Weight 02/05/21 2127 180 lb (81.6 kg)     Height 02/05/21 2127 6\' 2"  (1.88 m)     Head Circumference --      Peak Flow --  Pain Score 02/05/21 2139 3     Pain Loc --      Pain Edu? --      Excl. in GC? --      Physical Exam Constitutional:      General: He is not in acute distress.    Appearance: He is not ill-appearing.  HENT:     Head: Normocephalic.  Cardiovascular:     Pulses: Normal pulses.  Musculoskeletal:        General: Normal range of motion.  Skin:    General: Skin is warm.     Capillary Refill: Capillary refill takes less than 2 seconds.     Comments: Patient with fluctuant area to the left gluteal cleft, there is no involvement of the rectum, there is some warmth and tenderness to palpation in this area  Neurological:     Mental Status: He is alert.   EMERGENCY DEPARTMENT US SOFT TISSUE INTERPRETATION "Study: Limited Soft Tissue  Ultrasound"  INDICATIONS: Pain and Soft tissue infection Multiple views of the body part were obtained in real-time with a multi-frequency linear probe  PERFORMED BY: Myself IMAGES ARCHIVED?: Yes SIDE:Left BODY PART: Gluteus INTERPRETATION:  Abcess present    ED Results / Procedures / Treatments   Labs (all labs ordered are listed, but only abnormal results are displayed) Labs Reviewed - No data to display  EKG None  Radiology No results found.  Procedures .Marland KitchenIncision and Drainage  Date/Time: 02/05/2021 11:18 PM Performed by: Virgina Norfolk, DO Authorized by: Virgina Norfolk, DO   Consent:    Consent obtained:  Verbal   Consent given by:  Patient   Risks, benefits, and alternatives were discussed: yes     Risks discussed:  Bleeding, damage to other organs, incomplete drainage, infection and pain   Alternatives discussed:  No treatment and delayed treatment Universal protocol:    Procedure explained and questions answered to patient or proxy's satisfaction: yes     Relevant documents present and verified: yes     Imaging studies available: yes     Immediately prior to procedure, a time out was called: yes     Patient identity confirmed:  Verbally with patient Location:    Type:  Abscess   Size:  3 cm x 3 cm   Location:  Anogenital   Anogenital location:  Gluteal cleft Pre-procedure details:    Skin preparation:  Povidone-iodine Sedation:    Sedation type:  None Anesthesia:    Anesthesia method:  Local infiltration   Local anesthetic:  Lidocaine 1% WITH epi Procedure type:    Complexity:  Simple Procedure details:    Ultrasound guidance: no     Incision types:  Stab incision   Incision depth:  Dermal   Wound management:  Probed and deloculated   Drainage:  Purulent   Drainage amount:  Copious   Wound treatment:  Drain placed   Packing materials:  1/4 in iodoform gauze Post-procedure details:    Procedure completion:  Tolerated   Medications Ordered in  ED Medications  lidocaine-EPINEPHrine (XYLOCAINE W/EPI) 2 %-1:200000 (PF) injection 10 mL (10 mLs Infiltration Given 02/05/21 2248)  clindamycin (CLEOCIN) capsule 300 mg (300 mg Oral Given 02/05/21 2252)  oxyCODONE (Oxy IR/ROXICODONE) immediate release tablet 5 mg (5 mg Oral Given 02/05/21 2253)    ED Course  I have reviewed the triage vital signs and the nursing notes.  Pertinent labs & imaging results that were available during my care of the patient were reviewed by me and  considered in my medical decision making (see chart for details).    MDM Rules/Calculators/A&P                           Jamus Loving Amore is here with rectal pain, abscess.  Normal vitals.  No fever.  Appears to have an abscess in the left gluteal cleft.  Does not involve the rectum.  Bedside ultrasound confirms abscess likely 3 x 3 cm.  Warm and fluctuant and superficial.  I&D was performed successfully and abscess was probed and deloculated with copious amounts of purulent drainage.  Iodoform gauze drain was placed.  There was great improvement of swelling and pain after I&D.  Overall abscess appear to be superficial.  Does not have any history of the same.  Suspect inflamed hair follicle.  Patient given antibiotic and pain medicine.  Wound care instructions given.  Follow-up and return precautions given.  Discharged in good condition.  Understands return precautions.  This chart was dictated using voice recognition software.  Despite best efforts to proofread,  errors can occur which can change the documentation meaning.   Final Clinical Impression(s) / ED Diagnoses Final diagnoses:  Abscess of buttock, left    Rx / DC Orders ED Discharge Orders          Ordered    oxyCODONE (ROXICODONE) 5 MG immediate release tablet  Every 6 hours PRN        02/05/21 2314    clindamycin (CLEOCIN) 300 MG capsule  4 times daily        02/05/21 2314             Virgina Norfolk, DO 02/05/21 2320

## 2021-02-08 ENCOUNTER — Ambulatory Visit: Payer: 59 | Admitting: Family Medicine

## 2021-02-08 ENCOUNTER — Encounter: Payer: Self-pay | Admitting: Family Medicine

## 2021-02-08 ENCOUNTER — Other Ambulatory Visit: Payer: Self-pay

## 2021-02-08 VITALS — BP 110/76 | HR 68 | Temp 98.1°F | Ht 75.0 in | Wt 170.2 lb

## 2021-02-08 DIAGNOSIS — L0231 Cutaneous abscess of buttock: Secondary | ICD-10-CM

## 2021-02-08 NOTE — Progress Notes (Signed)
Subjective  CC:  Chief Complaint  Patient presents with   Cyst    Follow up, doing better    HPI: Randy Armstrong is a 20 y.o. male who presents to the office today to address the problems listed above in the chief complaint. 20 yo male with ED visit 9/26 due to pain in buttocks: exam revealed a large abscess. Imaging verified: no tracking or rectal involvement. S/p I&D and now on clinda. Feels much better. No longer needing pain meds. No f/c/s. Scant drainage today. Packing came out spontaneously yesterday.  Declines flu shot  Assessment  1. Abscess of right buttock      Plan  abscess:  resolving. Complete abx. Wound care. Healing.   Follow up: Return for complete physical.  Visit date not found  No orders of the defined types were placed in this encounter.  No orders of the defined types were placed in this encounter.     I reviewed the patients updated PMH, FH, and SocHx.    Patient Active Problem List   Diagnosis Date Noted   Pustular acne 07/03/2019   Seasonal allergic rhinitis due to pollen 04/22/2019   Childhood asthma, mild intermittent, uncomplicated 04/22/2019   ASTHMA 11/13/2008   Current Meds  Medication Sig   albuterol (VENTOLIN HFA) 108 (90 Base) MCG/ACT inhaler Inhale 2 puffs into the lungs every 4 (four) hours as needed for wheezing or shortness of breath.   clindamycin (CLEOCIN) 300 MG capsule Take 1 capsule (300 mg total) by mouth 4 (four) times daily for 10 days.   Multiple Vitamins-Minerals (MULTIVITAMIN ADULTS) TABS Take 1 tablet by mouth daily.   oxyCODONE (ROXICODONE) 5 MG immediate release tablet Take 1 tablet (5 mg total) by mouth every 6 (six) hours as needed for up to 10 doses for severe pain.    Allergies: Patient is allergic to diphenhydramine hcl. Family History: Patient family history includes Arthritis in his maternal grandmother and paternal grandmother; Cancer in his mother; Hypertension in his maternal grandmother and paternal  grandmother. Social History:  Patient  reports that he has never smoked. He has never used smokeless tobacco. He reports that he does not currently use drugs. He reports that he does not drink alcohol.  Review of Systems: Constitutional: Negative for fever malaise or anorexia Cardiovascular: negative for chest pain Respiratory: negative for SOB or persistent cough Gastrointestinal: negative for abdominal pain  Objective  Vitals: BP 110/76   Pulse 68   Temp 98.1 F (36.7 C) (Temporal)   Ht 6\' 3"  (1.905 m)   Wt 170 lb 3.2 oz (77.2 kg)   SpO2 97%   BMI 21.27 kg/m  General: no acute distress , A&Ox3 Right buttock near intergluteal fold: 48mm incision, clean and dry. No drainage or erythema. Minimal ttp    Commons side effects, risks, benefits, and alternatives for medications and treatment plan prescribed today were discussed, and the patient expressed understanding of the given instructions. Patient is instructed to call or message via MyChart if he/she has any questions or concerns regarding our treatment plan. No barriers to understanding were identified. We discussed Red Flag symptoms and signs in detail. Patient expressed understanding regarding what to do in case of urgent or emergency type symptoms.  Medication list was reconciled, printed and provided to the patient in AVS. Patient instructions and summary information was reviewed with the patient as documented in the AVS. This note was prepared with assistance of Dragon voice recognition software. Occasional wrong-word or sound-a-like substitutions  may have occurred due to the inherent limitations of voice recognition software  This visit occurred during the SARS-CoV-2 public health emergency.  Safety protocols were in place, including screening questions prior to the visit, additional usage of staff PPE, and extensive cleaning of exam room while observing appropriate contact time as indicated for disinfecting solutions.

## 2021-02-08 NOTE — Patient Instructions (Signed)
Please return for your annual complete physical; please come fasting. Your last was in 2020.  If you have any questions or concerns, please don't hesitate to send me a message via MyChart or call the office at [phone]. Thank you for visiting with [place] today! It's our pleasure caring for you.

## 2021-02-11 ENCOUNTER — Ambulatory Visit: Payer: 59 | Admitting: Family Medicine

## 2021-05-25 ENCOUNTER — Encounter: Payer: 59 | Admitting: Family Medicine

## 2021-08-07 ENCOUNTER — Encounter (HOSPITAL_COMMUNITY): Payer: Self-pay | Admitting: *Deleted

## 2021-08-07 ENCOUNTER — Ambulatory Visit (HOSPITAL_COMMUNITY)
Admission: EM | Admit: 2021-08-07 | Discharge: 2021-08-07 | Disposition: A | Discharge status: Home / Self Care | Payer: 59 | Attending: Nurse Practitioner | Admitting: Nurse Practitioner

## 2021-08-07 ENCOUNTER — Other Ambulatory Visit: Payer: Self-pay

## 2021-08-07 DIAGNOSIS — R1114 Bilious vomiting: Secondary | ICD-10-CM | POA: Diagnosis not present

## 2021-08-07 DIAGNOSIS — R197 Diarrhea, unspecified: Secondary | ICD-10-CM

## 2021-08-07 LAB — COMPREHENSIVE METABOLIC PANEL
ALT: 24 U/L (ref 0–44)
AST: 30 U/L (ref 15–41)
Albumin: 3.8 g/dL (ref 3.5–5.0)
Alkaline Phosphatase: 79 U/L (ref 38–126)
Anion gap: 8 (ref 5–15)
BUN: 6 mg/dL (ref 6–20)
CO2: 23 mmol/L (ref 22–32)
Calcium: 9.1 mg/dL (ref 8.9–10.3)
Chloride: 107 mmol/L (ref 98–111)
Creatinine, Ser: 0.82 mg/dL (ref 0.61–1.24)
GFR, Estimated: >60 mL/min (ref >60)
Glucose, Bld: 86 mg/dL (ref 70–99)
Potassium: 3.8 mmol/L (ref 3.5–5.1)
Sodium: 138 mmol/L (ref 135–145)
Total Bilirubin: 0.8 mg/dL (ref 0.3–1.2)
Total Protein: 8.3 g/dL — ABNORMAL HIGH (ref 6.5–8.1)

## 2021-08-07 LAB — CBC WITH DIFFERENTIAL/PLATELET
Abs Immature Granulocytes: 0.02 10*3/uL (ref 0.00–0.07)
Basophils Absolute: 0 10*3/uL (ref 0.0–0.1)
Basophils Relative: 0 %
Eosinophils Absolute: 0.2 10*3/uL (ref 0.0–0.5)
Eosinophils Relative: 2 %
HCT: 43.1 % (ref 39.0–52.0)
Hemoglobin: 14.4 g/dL (ref 13.0–17.0)
Immature Granulocytes: 0 %
Lymphocytes Relative: 55 %
Lymphs Abs: 4.9 10*3/uL — ABNORMAL HIGH (ref 0.7–4.0)
MCH: 31.1 pg (ref 26.0–34.0)
MCHC: 33.4 g/dL (ref 30.0–36.0)
MCV: 93.1 fL (ref 80.0–100.0)
Monocytes Absolute: 1.1 10*3/uL — ABNORMAL HIGH (ref 0.1–1.0)
Monocytes Relative: 12 %
Neutro Abs: 2.7 10*3/uL (ref 1.7–7.7)
Neutrophils Relative %: 31 %
Platelets: 397 10*3/uL (ref 150–400)
RBC: 4.63 MIL/uL (ref 4.22–5.81)
RDW: 12.3 % (ref 11.5–15.5)
WBC: 9 10*3/uL (ref 4.0–10.5)
nRBC: 0 % (ref 0.0–0.2)

## 2021-08-07 MED ORDER — ONDANSETRON 4 MG PO TBDP: 4.0000 mg | ORAL_TABLET | Freq: Three times a day (TID) | ORAL | 0 refills | Status: DC | PRN

## 2021-08-07 NOTE — ED Provider Notes (Signed)
?[facility] ? ? ? ?CSN: [id number] ?Arrival date & time: 08/07/21  1637 ? ? ?  ? ?History   ?Chief Complaint ?Chief Complaint  ?Patient presents with  ? Nausea  ?  Nausea/Vomiting/Diarrhea - Entered by patient  ? Emesis  ? Diarrhea  ? ? ?HPI ?Randy Armstrong is a 21 y.o. male.  ? ?Patient reports 2 weeks of nausea/vomiting.  Reports initially thought he had a stomach virus and his symptoms improved for couple days, however then they came back.  He reports he twice yesterday.  He has not vomited at all today.  He denies any nausea currently, or abdominal pain.  He has been having some diarrhea.  Today, he has been able to drink water and has eaten some rice this morning.  He denies fevers, blood in his vomit or stool, body aches, chills, new muscle pain. ? ? ?Past Medical History:  ?Diagnosis Date  ? Asthma   ? Seasonal allergic rhinitis due to pollen 04/22/2019  ? ? ?Patient Active Problem List  ? Diagnosis Date Noted  ? Pustular acne 07/03/2019  ? Seasonal allergic rhinitis due to pollen 04/22/2019  ? Childhood asthma, mild intermittent, uncomplicated 04/22/2019  ? ASTHMA 11/13/2008  ? ? ?History reviewed. No pertinent surgical history. ? ? ? ? ?Home Medications   ? ?Prior to Admission medications   ?Medication Sig Start Date End Date Taking? Authorizing Provider  ?ondansetron (ZOFRAN-ODT) 4 MG disintegrating tablet Take 1 tablet (4 mg total) by mouth every 8 (eight) hours as needed for nausea or vomiting. 08/07/21  Yes Valentino Nose, NP  ?Adapalene-Benzoyl Peroxide 0.1-2.5 % gel Apply to face nightly ?Patient not taking: Reported on 02/08/2021 04/22/19   Willow Ora, MD  ?albuterol (VENTOLIN HFA) 108 (90 Base) MCG/ACT inhaler Inhale 2 puffs into the lungs every 4 (four) hours as needed for wheezing or shortness of breath. 04/22/19   Willow Ora, MD  ?Multiple Vitamins-Minerals (MULTIVITAMIN ADULTS) TABS Take 1 tablet by mouth daily.    [provider]  ? ? ?Family History ?Family  History  ?Problem Relation Age of Onset  ? Cancer Mother   ? Arthritis Maternal Grandmother   ? Hypertension Maternal Grandmother   ? Arthritis Paternal Grandmother   ? Hypertension Paternal Grandmother   ? ? ?Social History ?Social History  ? ?Tobacco Use  ? Smoking status: Never  ? Smokeless tobacco: Never  ?Substance Use Topics  ? Alcohol use: No  ? Drug use: Not Currently  ? ? ? ?Allergies   ?Diphenhydramine hcl ? ? ?Review of Systems ?Review of Systems ?Per HPI ? ?Physical Exam ?Triage Vital Signs ?ED Triage Vitals  ?Enc Vitals Group  ?   BP 08/07/21 1752 113/68  ?   Pulse Rate 08/07/21 1752 75  ?   Resp 08/07/21 1752 18  ?   Temp 08/07/21 1752 98.3 ?F (36.8 ?C)  ?   Temp src --   ?   SpO2 08/07/21 1752 98 %  ?   Weight --   ?   Height --   ?   Head Circumference --   ?   Peak Flow --   ?   Pain Score 08/07/21 1750 0  ?   Pain Loc --   ?   Pain Edu? --   ?   Excl. in GC? --   ? ?No data found. ? ?Updated Vital Signs ?BP 113/68   Pulse 75   Temp 98.3 ?F (36.8 ?C)  Resp 18   SpO2 98%  ? ?Visual Acuity ?Right Eye Distance:   ?Left Eye Distance:   ?Bilateral Distance:   ? ?Right Eye Near:   ?Left Eye Near:    ?Bilateral Near:    ? ?Physical Exam ?Vitals and nursing note reviewed.  ?Constitutional:   ?   General: He is not in acute distress. ?   Appearance: Normal appearance. He is not toxic-appearing.  ?HENT:  ?   Head: Normocephalic and atraumatic.  ?Eyes:  ?   General: No scleral icterus. ?   Extraocular Movements: Extraocular movements intact.  ?Cardiovascular:  ?   Rate and Rhythm: Normal rate and regular rhythm.  ?Pulmonary:  ?   Effort: Pulmonary effort is normal. No respiratory distress.  ?   Breath sounds: Normal breath sounds. No wheezing, rhonchi or rales.  ?Abdominal:  ?   General: Abdomen is flat. Bowel sounds are normal. There is no distension.  ?   Palpations: Abdomen is soft. There is no mass.  ?   Tenderness: There is no abdominal tenderness. There is no right CVA tenderness, left CVA  tenderness or guarding.  ?Musculoskeletal:  ?   Cervical back: Normal range of motion.  ?Lymphadenopathy:  ?   Cervical: No cervical adenopathy.  ?Skin: ?   General: Skin is warm and dry.  ?   Capillary Refill: Capillary refill takes less than 2 seconds.  ?   Coloration: Skin is not jaundiced or pale.  ?   Findings: No erythema.  ?Neurological:  ?   Mental Status: He is alert and oriented to person, place, and time.  ?   Motor: No weakness.  ?   Gait: Gait normal.  ?Psychiatric:     ?   Mood and Affect: Mood normal.     ?   Behavior: Behavior normal.  ? ? ? ?UC Treatments / Results  ?Labs ?(all labs ordered are listed, but only abnormal results are displayed) ?Labs Reviewed  ?CBC WITH DIFFERENTIAL/PLATELET  ?COMPREHENSIVE METABOLIC PANEL  ? ? ?EKG ? ? ?Radiology ?No results found. ? ?Procedures ?Procedures (including critical care time) ? ?Medications Ordered in UC ?Medications - No data to display ? ?Initial Impression / Assessment and Plan / UC Course  ?I have reviewed the triage vital signs and the nursing notes. ? ?Pertinent labs & imaging results that were available during my care of the patient were reviewed by me and considered in my medical decision making (see chart for details). ? ?  ?Suspect ongoing viral gastroenteritis.  The patient is well-appearing, does not appear to be dehydrated, vital signs are stable today.  No abdominal pain on exam today.  Start ondansetron 4 mg under his tongue every 8 hours as needed for nausea/vomiting.  Encouraged plenty of liquids, soft diet over the next couple of days.  If symptoms persist or do not improve, follow-up with primary care provider.  If symptoms worsen, go to emergency room. ?Final Clinical Impressions(s) / UC Diagnoses  ? ?Final diagnoses:  ?Bilious vomiting with nausea  ?Diarrhea, unspecified type  ? ? ? ?Discharge Instructions   ? ?  ?- You can use the nausea medication under your tongue every 8 hours as needed for nausea or vomiting  ?- we will let you  know if any of the blood work comes back abnormal ?- Please continue eating bland/easy to digest foods until you are feeling better ?- Please follow up with your PCP if your symptoms persist ? ? ? ?ED Prescriptions   ? ?  Medication Sig Dispense Auth. Provider  ? ondansetron (ZOFRAN-ODT) 4 MG disintegrating tablet Take 1 tablet (4 mg total) by mouth every 8 (eight) hours as needed for nausea or vomiting. 20 tablet Valentino Nose, NP  ? ?  ? ?PDMP not reviewed this encounter. ?  ?Valentino Nose, NP ?08/07/21 1846 ? ?

## 2021-08-07 NOTE — ED Triage Notes (Signed)
Pt reports vomiting and diarrhea for a couple of weeks. ?

## 2021-08-07 NOTE — Discharge Instructions (Addendum)
-   You can use the nausea medication under your tongue every 8 hours as needed for nausea or vomiting  ?- we will let you know if any of the blood work comes back abnormal ?- Please continue eating bland/easy to digest foods until you are feeling better ?- Please follow up with your PCP if your symptoms persist ?

## 2021-08-18 ENCOUNTER — Ambulatory Visit (INDEPENDENT_AMBULATORY_CARE_PROVIDER_SITE_OTHER): Payer: 59 | Admitting: Family Medicine

## 2021-08-18 ENCOUNTER — Encounter: Payer: Self-pay | Admitting: Family Medicine

## 2021-08-18 VITALS — BP 112/65 | HR 63 | Temp 98.0°F | Ht 75.0 in | Wt 172.4 lb

## 2021-08-18 DIAGNOSIS — J301 Allergic rhinitis due to pollen: Secondary | ICD-10-CM

## 2021-08-18 DIAGNOSIS — R112 Nausea with vomiting, unspecified: Secondary | ICD-10-CM

## 2021-08-18 DIAGNOSIS — A084 Viral intestinal infection, unspecified: Secondary | ICD-10-CM | POA: Diagnosis not present

## 2021-08-18 MED ORDER — FLUTICASONE PROPIONATE 50 MCG/ACT NA SUSP: 1.0000 | Freq: Every day | NASAL | 6 refills | Status: DC

## 2021-08-18 NOTE — Patient Instructions (Addendum)
Please return for your annual complete physical; please come fasting.  ? ?Add flonase to zyrtec for your allergies.  ? ?If you have any questions or concerns, please don't hesitate to send me a message via MyChart or call the office at [phone]. Thank you for visiting with [place] today! It's our pleasure caring for you.  ? ?Allergic Rhinitis, Adult ?Allergic rhinitis is an allergic reaction that affects the mucous membrane inside the nose. The mucous membrane is the tissue that produces mucus. ?There are two types of allergic rhinitis: ?Seasonal. This type is also called hay fever and happens only during certain seasons. ?Perennial. This type can happen at any time of the year. ?Allergic rhinitis cannot be spread from person to person. This condition can be mild, moderate, or severe. It can develop at any age and may be outgrown. ?What are the causes? ?This condition is caused by allergens. These are things that can cause an allergic reaction. Allergens may differ for seasonal allergic rhinitis and perennial allergic rhinitis. ?Seasonal allergic rhinitis is triggered by pollen. Pollen can come from grasses, trees, and weeds. ?Perennial allergic rhinitis may be triggered by: ?Dust mites. ?Proteins in a pet's urine, saliva, or dander. Dander is dead skin cells from a pet. ?Smoke, mold, or car fumes. ?What increases the risk? ?You are more likely to develop this condition if you have a family history of allergies or other conditions related to allergies, including: ?Allergic conjunctivitis. This is inflammation of parts of the eyes and eyelids. ?Asthma. This condition affects the lungs and makes it hard to breathe. ?Atopic dermatitis or eczema. This is long term (chronic) inflammation of the skin. ?Food allergies. ?What are the signs or symptoms? ?Symptoms of this condition include: ?Sneezing or coughing. ?A stuffy nose (nasal congestion), itchy nose, or nasal discharge. ?Itchy eyes and tearing of the eyes. ?A feeling  of mucus dripping down the back of your throat (postnasal drip). ?Trouble sleeping. ?Tiredness or fatigue. ?Headache. ?Sore throat. ?How is this diagnosed? ?This condition may be diagnosed with your symptoms, medical history, and physical exam. Your health care provider may check for related conditions, such as: ?Asthma. ?Pink eye. This is eye inflammation caused by infection (conjunctivitis). ?Ear infection. ?Upper respiratory infection. This is an infection in the nose, throat, or upper airways. ?You may also have tests to find out which allergens trigger your symptoms. These may include skin tests or blood tests. ?How is this treated? ?There is no cure for this condition, but treatment can help control symptoms. Treatment may include: ?Taking medicines that block allergy symptoms, such as corticosteroids and antihistamines. Medicine may be given as a shot, nasal spray, or pill. ?Avoiding any allergens. ?Being exposed again and again to tiny amounts of allergens to help you build a defense against allergens (immunotherapy). This is done if other treatments have not helped. It may include: ?Allergy shots. These are injected medicines that have small amounts of allergen in them. ?Sublingual immunotherapy. This involves taking small doses of a medicine with allergen in it under your tongue. ?If these treatments do not work, your health care provider may prescribe newer, stronger medicines. ?Follow these instructions at home: ?Avoiding allergens ?Find out what you are allergic to and avoid those allergens. These are some things you can do to help avoid allergens: ?If you have perennial allergies: ?Replace carpet with wood, tile, or vinyl flooring. Carpet can trap dander and dust. ?Do not smoke. Do not allow smoking in your home. ?Change your heating and air conditioning  filters at least once a month. ?If you have seasonal allergies, take these steps during allergy season: ?Keep windows closed as much as  possible. ?Plan outdoor activities when pollen counts are lowest. Check pollen counts before you plan outdoor activities. ?When coming indoors, change clothing and shower before sitting on furniture or bedding. ?If you have a pet in the house that produces allergens: ?Keep the pet out of the bedroom. ?Vacuum, sweep, and dust regularly. ?General instructions ?Take over-the-counter and prescription medicines only as told by your health care provider. ?Drink enough fluid to keep your urine pale yellow. ?Keep all follow-up visits as told by your health care provider. This is important. ?Where to find more information ?American Academy of Allergy, Asthma & Immunology: www.aaaai.org ?Contact a health care provider if: ?You have a fever. ?You develop a cough that does not go away. ?You make whistling sounds when you breathe (wheeze). ?Your symptoms slow you down or stop you from doing your normal activities each day. ?Get help right away if: ?You have shortness of breath. ?This symptom may represent a serious problem that is an emergency. Do not wait to see if the symptom will go away. Get medical help right away. Call your local emergency services (911 in the [place] Do not drive yourself to the hospital. ?Summary ?Allergic rhinitis may be managed by taking medicines as directed and avoiding allergens. ?If you have seasonal allergies, keep windows closed as much as possible during allergy season. ?Contact your health care provider if you develop a fever or a cough that does not go away. ?This information is not intended to replace advice given to you by your health care provider. Make sure you discuss any questions you have with your health care provider. ?Document Revised: 06/21/2019 Document Reviewed: 04/30/2019 ?Elsevier Patient Education ? 2022 [employer] ? ?

## 2021-08-18 NOTE — Progress Notes (Signed)
? ?Subjective  ?CC:  ?Chief Complaint  ?Patient presents with  ? Follow-up  ?  Pt was seen in Urgent Care 08/07/2021 for nausea and vomiting. He was given Zofran he states that he is feeling much better. He denies nausea and vomiting.   ? ? ?HPI: Randy Armstrong is a 21 y.o. male who presents to the office today to address the problems listed above in the chief complaint. ?Reviewed urgent care notes from March 25.  Had about a week of nausea vomiting with some diarrhea.  Diagnosed with gastroenteritis.  Zofran helped.  However a week or so later he had persistent nausea and vomiting postprandial.  No fevers.  No abdominal pain.  No heartburn or reflux symptoms.  No history of GERD.  Normal bowel movements.  Now for the last 5 days he is feeling much better.  No more nausea or vomiting.  Just wants to make sure everything is okay.  I reviewed notes, unremarkable CMP and CBC.  Normal KUB. ?Chronic allergies: Reactive.  On Zyrtec and Mucinex.  Complains of rhinorrhea and postnasal drainage.  Some coughing.  Irritant.  No shortness of breath or active asthma symptoms ? ?Assessment  ?1. Viral gastroenteritis   ?2. Nausea and vomiting, unspecified vomiting type   ?3. Seasonal allergic rhinitis due to pollen   ? ?  ?Plan  ?Viral gastroenteritis with persistent nausea vomiting: Suspect mild gastritis after his GI bug.  Currently resolved.  Will monitor.  Reassured.  Add Pepcid over-the-counter if symptoms return.  Benign abdomen today ?Seasonal allergies with pollen: Add Flonase to Zyrtec.  Education given. ? ?Follow up: Complete physical at convenience ?Visit date not found ? ?No orders of the defined types were placed in this encounter. ? ?Meds ordered this encounter  ?Medications  ? fluticasone (FLONASE) 50 MCG/ACT nasal spray  ?  Sig: Place 1 spray into both nostrils daily.  ?  Dispense:  16 g  ?  Refill:  6  ? ?  ? ?I reviewed the patients updated PMH, FH, and SocHx.  ?  ?Patient Active Problem List  ? Diagnosis Date  Noted  ? Pustular acne 07/03/2019  ? Seasonal allergic rhinitis due to pollen 04/22/2019  ? Childhood asthma, mild intermittent, uncomplicated 04/22/2019  ? ?Current Meds  ?Medication Sig  ? Adapalene-Benzoyl Peroxide 0.1-2.5 % gel Apply to face nightly  ? albuterol (VENTOLIN HFA) 108 (90 Base) MCG/ACT inhaler Inhale 2 puffs into the lungs every 4 (four) hours as needed for wheezing or shortness of breath.  ? chlorhexidine (PERIDEX) 0.12 % solution 15 mLs 2 (two) times daily.  ? fluticasone (FLONASE) 50 MCG/ACT nasal spray Place 1 spray into both nostrils daily.  ? SODIUM FLUORIDE 5000 PLUS 1.1 % CREA dental cream SMARTSIG:Topical  ? ? ?Allergies: ?Patient is allergic to diphenhydramine hcl. ?Family History: ?Patient family history includes Arthritis in his maternal grandmother and paternal grandmother; Cancer in his mother; Hypertension in his maternal grandmother and paternal grandmother. ?Social History:  ?Patient  reports that he has never smoked. He has never used smokeless tobacco. He reports that he does not currently use drugs. He reports that he does not drink alcohol. ? ?Review of Systems: ?Constitutional: Negative for fever malaise or anorexia ?Cardiovascular: negative for chest pain ?Respiratory: negative for SOB or persistent cough ?Gastrointestinal: negative for abdominal pain ? ?Objective  ?Vitals: BP 112/65   Pulse 63   Temp 98 ?F (36.7 ?C) (Temporal)   Ht 6\' 3"  (1.905 m)   Wt 172  lb 6.4 oz (78.2 kg)   SpO2 97%   BMI 21.55 kg/m?  ?General: no acute distress , A&Ox3 ?HEENT: PEERL, conjunctiva normal, neck is supple ?Cardiovascular:  RRR without murmur or gallop.  ?Respiratory:  Good breath sounds bilaterally, CTAB with normal respiratory effort ?Skin:  Warm, no rashes ?Benign abdomen ? ?No visits with results within 1 Day(s) from this visit.  ?Latest known visit with results is:  ?Admission on 08/07/2021, Discharged on 08/07/2021  ?Component Date Value Ref Range Status  ? WBC 08/07/2021 9.0  4.0  - 10.5 K/uL Final  ? RBC 08/07/2021 4.63  4.22 - 5.81 MIL/uL Final  ? Hemoglobin 08/07/2021 14.4  13.0 - 17.0 g/dL Final  ? HCT 49/17/9150 43.1  39.0 - 52.0 % Final  ? MCV 08/07/2021 93.1  80.0 - 100.0 fL Final  ? MCH 08/07/2021 31.1  26.0 - 34.0 pg Final  ? MCHC 08/07/2021 33.4  30.0 - 36.0 g/dL Final  ? RDW 56/97/9480 12.3  11.5 - 15.5 % Final  ? Platelets 08/07/2021 397  150 - 400 K/uL Final  ? nRBC 08/07/2021 0.0  0.0 - 0.2 % Final  ? Neutrophils Relative % 08/07/2021 31  % Final  ? Neutro Abs 08/07/2021 2.7  1.7 - 7.7 K/uL Final  ? Lymphocytes Relative 08/07/2021 55  % Final  ? Lymphs Abs 08/07/2021 4.9 (H)  0.7 - 4.0 K/uL Final  ? Monocytes Relative 08/07/2021 12  % Final  ? Monocytes Absolute 08/07/2021 1.1 (H)  0.1 - 1.0 K/uL Final  ? Eosinophils Relative 08/07/2021 2  % Final  ? Eosinophils Absolute 08/07/2021 0.2  0.0 - 0.5 K/uL Final  ? Basophils Relative 08/07/2021 0  % Final  ? Basophils Absolute 08/07/2021 0.0  0.0 - 0.1 K/uL Final  ? Immature Granulocytes 08/07/2021 0  % Final  ? Abs Immature Granulocytes 08/07/2021 0.02  0.00 - 0.07 K/uL Final  ? Sodium 08/07/2021 138  135 - 145 mmol/L Final  ? Potassium 08/07/2021 3.8  3.5 - 5.1 mmol/L Final  ? Chloride 08/07/2021 107  98 - 111 mmol/L Final  ? CO2 08/07/2021 23  22 - 32 mmol/L Final  ? Glucose, Bld 08/07/2021 86  70 - 99 mg/dL Final  ? BUN 16/55/3748 6  6 - 20 mg/dL Final  ? Creatinine, Ser 08/07/2021 0.82  0.61 - 1.24 mg/dL Final  ? Calcium 27/11/8673 9.1  8.9 - 10.3 mg/dL Final  ? Total Protein 08/07/2021 8.3 (H)  6.5 - 8.1 g/dL Final  ? Albumin 44/92/0100 3.8  3.5 - 5.0 g/dL Final  ? AST 71/21/9758 30  15 - 41 U/L Final  ? ALT 08/07/2021 24  0 - 44 U/L Final  ? Alkaline Phosphatase 08/07/2021 79  38 - 126 U/L Final  ? Total Bilirubin 08/07/2021 0.8  0.3 - 1.2 mg/dL Final  ? GFR, Estimated 08/07/2021 >60  >60 mL/min Final  ? Anion gap 08/07/2021 8  5 - 15 Final  ? ? ? ?Commons side effects, risks, benefits, and alternatives for medications and  treatment plan prescribed today were discussed, and the patient expressed understanding of the given instructions. Patient is instructed to call or message via MyChart if he/she has any questions or concerns regarding our treatment plan. No barriers to understanding were identified. We discussed Red Flag symptoms and signs in detail. Patient expressed understanding regarding what to do in case of urgent or emergency type symptoms.  ?Medication list was reconciled, printed and provided to the patient in  AVS. Patient instructions and summary information was reviewed with the patient as documented in the AVS. ?This note was prepared with assistance of Systems analyst. Occasional wrong-word or sound-a-like substitutions may have occurred due to the inherent limitations of voice recognition software ? ?This visit occurred during the SARS-CoV-2 public health emergency.  Safety protocols were in place, including screening questions prior to the visit, additional usage of staff PPE, and extensive cleaning of exam room while observing appropriate contact time as indicated for disinfecting solutions.  ? ?

## 2021-09-15 ENCOUNTER — Encounter: Payer: 59 | Admitting: Family Medicine

## 2022-02-07 ENCOUNTER — Encounter: Payer: Self-pay | Admitting: *Deleted

## 2022-03-28 ENCOUNTER — Other Ambulatory Visit (HOSPITAL_COMMUNITY): Payer: Self-pay

## 2022-04-28 ENCOUNTER — Encounter: Payer: Self-pay | Admitting: *Deleted

## 2022-05-04 ENCOUNTER — Other Ambulatory Visit (HOSPITAL_COMMUNITY): Payer: Self-pay

## 2022-05-04 ENCOUNTER — Encounter (HOSPITAL_COMMUNITY): Payer: Self-pay

## 2022-05-05 ENCOUNTER — Encounter (HOSPITAL_COMMUNITY): Payer: Self-pay

## 2022-05-05 ENCOUNTER — Other Ambulatory Visit (HOSPITAL_COMMUNITY): Payer: Self-pay

## 2022-06-20 ENCOUNTER — Other Ambulatory Visit (HOSPITAL_COMMUNITY)
Admission: RE | Admit: 2022-06-20 | Discharge: 2022-06-20 | Disposition: A | Discharge status: Home / Self Care | Payer: 59 | Source: Ambulatory Visit | Attending: Family Medicine | Admitting: Family Medicine

## 2022-06-20 ENCOUNTER — Ambulatory Visit (INDEPENDENT_AMBULATORY_CARE_PROVIDER_SITE_OTHER): Payer: 59 | Admitting: Family Medicine

## 2022-06-20 ENCOUNTER — Encounter: Payer: Self-pay | Admitting: Family Medicine

## 2022-06-20 VITALS — BP 118/60 | HR 83 | Temp 98.3°F | Ht 75.0 in | Wt 184.0 lb

## 2022-06-20 DIAGNOSIS — Z206 Contact with and (suspected) exposure to human immunodeficiency virus [HIV]: Secondary | ICD-10-CM | POA: Insufficient documentation

## 2022-06-20 NOTE — Patient Instructions (Signed)
Please return for your annual complete physical; please come fasting.   I will release your lab results to you on your MyChart account with further instructions. You may see the results before I do, but when I review them I will send you a message with my report or have my assistant call you if things need to be discussed. Please reply to my message with any questions. Thank you!   If you have any questions or concerns, please don't hesitate to send me a message via MyChart or call the office at [phone]. Thank you for visiting with [place] today! It's our pleasure caring for you.   Preventing Sexually Transmitted Infections, Adult Sexually transmitted infections (STIs) are spread from person to person (are contagious). They are spread, or transmitted, during sex. The sex may be vaginal, anal, or oral. STIs can be passed during sexual contact with skin, genitals, mouth, or rectum. They may spread through body fluids, such as saliva, semen, blood, vaginal mucus, and urine. STIs are very common. They can happen in people of all ages. Some common STIs are: Herpes. Hepatitis B. Chlamydia. Gonorrhea. Syphilis. Trichomoniasis. Human papillomavirus (HPV). Human immunodeficiency virus (HIV). This can cause acquired immunodeficiency syndrome (AIDS). How can STIs affect me? You may not have symptoms with an STI. Even if you do not have symptoms, you can still spread the infection to others. You also still need treatment. STIs can be treated. Some STIs can be cured. Other STIs cannot be cured and will affect you for the rest of your life. Certain STIs may: Require you to take medicine for the rest of your life. Affect your ability to have children. Increase your risk for getting other STIs. Increase your risk of getting certain conditions. These may include: Cervical cancer. Pelvic inflammatory disease (PID). Organ damage or damage to other parts of your body. This can happen if the infection  spreads. Cause problems during pregnancy. STIs may be spread to the baby during pregnancy or birth. Females tend to have more severe problems from STIs than males. What can increase my risk? You may be more at risk for an STI if: You do not use protection during sex. You have more than one sex partner. You have a sex partner who has other sex partners. You have sex with a person who has an STI. You have an STI, or you have had an STI before. You inject drugs or have a sex partner who injects drugs. What actions can I take to prevent STIs? The only way to fully prevent STIs is not to have sex of any kind. This is called practicing abstinence. If you are sexually active, you can protect yourself and others by taking these actions to lower your risk of getting an STI: Lifestyle Have only one sex partner or limit the number of sex partners you have. Avoid having sex after you have alcohol or drugs. Alcohol and drugs can affect your ability to make good choices. This can lead to risky sexual behaviors. Go to prevention counseling. This can teach you how to avoid getting an STI. Barrier protection  Use methods to stop body fluids from being exchanged between partners during sex (barrier protection). These methods can be used during oral, vaginal, or anal sex. They include: External condom, for males. Internal condom, for females. Dental dam. Use a new barrier method for every sex act from start to finish. Know that a barrier method may not protect you from all STIs. Some STIs, such as  herpes, are spread through skin-to-skin contact. Avoid all sexual contact if you or a partner has herpes and there is an active flare with open sores. Birth control pills, injections, implants, and intrauterine devices (IUDs) do not protect against STIs. To prevent both STIs and pregnancy, always use a condom with a second form of birth control. General information Ask your health care provider about taking  pre-exposure prophylaxis (PrEP) to prevent HIV. Stay up to date on your vaccines. Some vaccines can lower your risk of getting certain STIs. These include: Hepatitis B vaccine. HPV vaccine. This is recommended for people up to age 36. Get tested for STIs. Have your partners get tested, too. If you test positive for an STI, follow recommendations from your health care provider about treatment. Make sure your sex partners are tested and treated as well. Where to find more information Learn more about STIs from: Centers for Disease Control and Prevention (CDC): More information about certain STIs: StoreMirror.com.cy Places to get sexual health counseling and treatment for free or at a low cost: gettested.StoreMirror.com.cy U.S. Department of Health and Human Services [facility]): LegalWarrants.gl This information is not intended to replace advice given to you by your health care provider. Make sure you discuss any questions you have with your health care provider. Document Revised: 12/14/2021 Document Reviewed: 10/15/2021 Elsevier Patient Education  [place].

## 2022-06-20 NOTE — Progress Notes (Signed)
    Subjective  CC:  Chief Complaint  Patient presents with   HIV testing     Pt stated that he has been exposed to HIV. Exposed in December. Partner was dx in Dec 2023.    HPI: Randy Armstrong is a 22 y.o. male who presents to the office today to address the problems listed above in the chief complaint. 22 yo male was exposed to HIV: he learned ex partner was dxd in December. Pt feels well. Used condoms. No longer in sexual relationship with this partner. Pt is asymptomatic.    Assessment  1. Exposure to HIV      Plan  Std screen:  check labs. Education given. Rec condoms. Consider PrEP IF he resumes a relationship with this partner. Pt understands. All questions answered  Follow up: due for cpe  Visit date not found  Orders Placed This Encounter  Procedures   HIV antibody (with reflex)   RPR   Hepatitis C antibody   No orders of the defined types were placed in this encounter.     I reviewed the patients updated PMH, FH, and SocHx.    Patient Active Problem List   Diagnosis Date Noted   Pustular acne 07/03/2019   Seasonal allergic rhinitis due to pollen 04/22/2019   Childhood asthma, mild intermittent, uncomplicated 82/42/3536   Current Meds  Medication Sig   chlorhexidine (PERIDEX) 0.12 % solution 15 mLs 2 (two) times daily.   SODIUM FLUORIDE 5000 PLUS 1.1 % CREA dental cream SMARTSIG:Topical    Allergies: Patient is allergic to diphenhydramine hcl. Family History: Patient family history includes Arthritis in his maternal grandmother and paternal grandmother; Cancer in his mother; Hypertension in his maternal grandmother and paternal grandmother. Social History:  Patient  reports that he has never smoked. He has never used smokeless tobacco. He reports that he does not currently use drugs. He reports that he does not drink alcohol.  Review of Systems: Constitutional: Negative for fever malaise or anorexia Cardiovascular: negative for chest pain Respiratory:  negative for SOB or persistent cough Gastrointestinal: negative for abdominal pain  Objective  Vitals: BP 118/60   Pulse 83   Temp 98.3 F (36.8 C)   Ht 6\' 3"  (1.905 m)   Wt 184 lb (83.5 kg)   SpO2 96%   BMI 23.00 kg/m  General: no acute distress , A&Ox3   Commons side effects, risks, benefits, and alternatives for medications and treatment plan prescribed today were discussed, and the patient expressed understanding of the given instructions. Patient is instructed to call or message via MyChart if he/she has any questions or concerns regarding our treatment plan. No barriers to understanding were identified. We discussed Red Flag symptoms and signs in detail. Patient expressed understanding regarding what to do in case of urgent or emergency type symptoms.  Medication list was reconciled, printed and provided to the patient in AVS. Patient instructions and summary information was reviewed with the patient as documented in the AVS. This note was prepared with assistance of Dragon voice recognition software. Occasional wrong-word or sound-a-like substitutions may have occurred due to the inherent limitations of voice recognition software

## 2022-06-22 LAB — URINE CYTOLOGY ANCILLARY ONLY
Chlamydia: NEGATIVE
Comment: NEGATIVE
Comment: NORMAL
Neisseria Gonorrhea: NEGATIVE

## 2022-06-22 LAB — RPR: RPR Ser Ql: REACTIVE — AB

## 2022-06-22 LAB — RPR TITER: RPR Titer: 1:16 {titer} — ABNORMAL HIGH

## 2022-06-22 LAB — T PALLIDUM AB: T Pallidum Abs: POSITIVE — AB

## 2022-06-22 LAB — HIV ANTIBODY (ROUTINE TESTING W REFLEX): HIV 1&2 Ab, 4th Generation: REACTIVE — AB

## 2022-06-22 LAB — HIV-1/2 AB - DIFFERENTIATION
HIV-1 antibody: POSITIVE — AB
HIV-2 Ab: NEGATIVE

## 2022-06-22 LAB — HEPATITIS C ANTIBODY: Hepatitis C Ab: NONREACTIVE

## 2022-06-22 NOTE — Progress Notes (Signed)
Please call patient: I have reviewed his/her lab results. Please call patient and schedule office visit with me to go over results.   Please ensure we have Bicillin LA 2.4 mg available for IM injection.

## 2022-06-23 ENCOUNTER — Telehealth: Payer: Self-pay | Admitting: Family Medicine

## 2022-06-23 ENCOUNTER — Encounter: Payer: Self-pay | Admitting: Family Medicine

## 2022-06-23 NOTE — Telephone Encounter (Signed)
Called pt to make appt for office visit, left message on VM for pt to call us back. Just an [place]

## 2022-06-24 ENCOUNTER — Encounter: Payer: Self-pay | Admitting: Family Medicine

## 2022-06-24 ENCOUNTER — Ambulatory Visit: Payer: 59 | Admitting: Family Medicine

## 2022-06-24 VITALS — BP 110/60 | HR 69 | Temp 98.5°F | Ht 75.0 in | Wt 181.8 lb

## 2022-06-24 DIAGNOSIS — Z21 Asymptomatic human immunodeficiency virus [HIV] infection status: Secondary | ICD-10-CM | POA: Diagnosis not present

## 2022-06-24 DIAGNOSIS — A539 Syphilis, unspecified: Secondary | ICD-10-CM

## 2022-06-24 LAB — CBC WITH DIFFERENTIAL/PLATELET
Basophils Absolute: 0 10*3/uL (ref 0.0–0.1)
Basophils Relative: 0.6 % (ref 0.0–3.0)
Eosinophils Absolute: 0.1 10*3/uL (ref 0.0–0.7)
Eosinophils Relative: 2.5 % (ref 0.0–5.0)
HCT: 40.5 % (ref 39.0–52.0)
Hemoglobin: 13.7 g/dL (ref 13.0–17.0)
Lymphocytes Relative: 63.5 % — ABNORMAL HIGH (ref 12.0–46.0)
Lymphs Abs: 3.5 10*3/uL (ref 0.7–4.0)
MCHC: 33.9 g/dL (ref 30.0–36.0)
MCV: 95.4 fl (ref 78.0–100.0)
Monocytes Absolute: 0.5 10*3/uL (ref 0.1–1.0)
Monocytes Relative: 8.9 % (ref 3.0–12.0)
Neutro Abs: 1.3 10*3/uL — ABNORMAL LOW (ref 1.4–7.7)
Neutrophils Relative %: 24.5 % — ABNORMAL LOW (ref 43.0–77.0)
Platelets: 304 10*3/uL (ref 150.0–400.0)
RBC: 4.24 Mil/uL (ref 4.22–5.81)
RDW: 12 % (ref 11.5–15.5)
WBC: 5.4 10*3/uL (ref 4.0–10.5)

## 2022-06-24 MED ORDER — PENICILLIN G BENZATHINE 1200000 UNIT/2ML IM SUSY
1.2000 10*6.[IU] | PREFILLED_SYRINGE | Freq: Once | INTRAMUSCULAR | Status: AC
  Administered 2022-06-24: 1.2 10*6.[IU] via INTRAMUSCULAR

## 2022-06-24 NOTE — Patient Instructions (Addendum)
I have placed a referral to Infectious Disease; these specialists will evaluate you further and start the appropriate treatment. I'd like this appointment to be scheduled as soon as possible. If you do not hear back from [place] within a week, please let me know.  Another resource in our community is the [facility].  The intake line number is (706)129-6561; this is a good place to start getting more information and support. They have support groups as well.  Midge Aver is the Director of prevention and peer support, his number is [phone].  Syphilis Syphilis is a curable infection that can spread through sexual contact. It is important to get treatment right away to reduce the possibility of serious complications. There are four stages of syphilis: Primary stage. During this stage, sores may form where the disease entered your body. Secondary stage. During this stage, skin rashes and lesions will form. Latent stage. During this stage, there are no symptoms, but the infection may still be contagious. Tertiary stage. This stage happens 5-30 years after the infection starts. During this stage, the disease damages organs and can lead to death. Most people with treated syphilis do not develop this stage. What are the causes? This condition is caused by bacteria called Treponema pallidum. The condition can spread during sexual activity, such as during oral, anal, or vaginal sex. It can also be spread to an unborn baby (fetus) during pregnancy. What increases the risk? You are more likely to develop this condition if: You do not use a condom during sex. You have sex with a partner who has syphilis. You have a history of sexually transmitted infections (STIs). You use recreational drugs such as methamphetamines, injection drugs, or heroin. What are the signs or symptoms? Symptoms of this condition depend on the stage of the disease. Primary stage One or more painless sores  (chancres) in and around the genital organs, mouth, or hands. The sores are usually firm and round. Secondary stage Skin rashes or sores (or both) in the mouth, vagina, anus, palms of the hands, or bottoms of the feet. The rash may be rough, red or reddish-brown, and may not itch. Other symptoms may include: A fever. Swollen lymph glands. A sore throat. Patchy hair loss. A headache. A feeling of being ill or very tired. Weight loss. Latent stage There are no symptoms during this stage. Tertiary stage This stage is rarely noted due to the use of antibiotic medicine to treat syphilis. However, without treatment, syphilis can further affect different organ systems, especially the heart and nervous system. Signs and symptoms impacting the heart include: Enlargement of the heart. Damage to the aorta. Narrowing of the blood vessels around the heart. Signs and symptoms impacting the nervous system include: Meningitis. Dysfunction of the nerves to the head. Nonspecific growths may also occur in the skin, mucous membranes, bones, or body organs in this stage. Any stage if there is no treatment Signs and symptoms of impacting the nervous system include: A severe headache. Muscle weakness or difficulty coordinating movements, such as walking. Changes in mental state, such as confusion, personality changes, or dementia. Signs and symptoms of impacting the eyes include: Eye pain. Eye redness. Changes in vision. Blindness. Signs and symptoms of impacting the ears include: Hearing loss. Ringing in the ears (tinnitus). Dizziness or feeling like you or your surroundings are moving or spinning (vertigo). How is this diagnosed? This condition is diagnosed with: A physical exam. Blood tests, including: Nontreponemal test. This is usually the  first test. It can detect other kinds of antibodies as well. Treponemal test. This test is done if you get a positive result on the nontreponemal test. It  specifically looks for antibodies to syphilis. This test will not show whether antibodies are from a past syphilis infection or a current infection. Tests of the fluid (drainage) from a sore or rash. Tests of the fluid around the spine (lumbar puncture). These tests are done to check for an infection in the brain or nervous system in late-stage syphilis. Imaging tests. These may be done to check for damage to the heart, aorta, or brain if the condition is in the tertiary stage. Tests may include: X-ray. CT scan. MRI. Echocardiogram. This test takes a picture of the heart. How is this treated? This condition can be cured with antibiotic medicine. It is important to receive treatment as soon as possible to get rid of the infection. However, the treatment may not undo any damage already caused by the infection. Follow these instructions at home: Medicines  Take over-the-counter and prescription medicines only as told by your health care provider. Take your antibiotic medicine as told by your health care provider. Do not stop taking the antibiotic even if you start to feel better. Incomplete treatment will put you at risk for continued infection and could be life-threatening. General instructions Do not have sex until your treatment is completed, or as directed by your health care provider. Tell your recent sexual partners that you were diagnosed with syphilis. It is important that they get treatment, even if they do not have symptoms. Keep all follow-up visits. This is important. How is this prevented? Use latex or polyurethane condoms correctly whenever you have sex. Before you have sex, ask your partner if they have been tested for STIs. Ask about the test results. Avoid having multiple sexual partners. Contact a health care provider if: You continue to have any of the following symptoms 24 hours after beginning treatment: Fever. Chills. Headache. Nausea. Aching all over your body. Your  symptoms do not improve, even with treatment. Get help right away if: You have severe chest pain. You have trouble walking or coordinating movements. You are confused. You lose vision or hearing. You have numbness in your arms or legs. You have a seizure. You faint. You have a severe headache that does not go away with medicine. These symptoms may be an emergency. Get help right away. Call 911. Do not wait to see if the symptoms will go away. Do not drive yourself to the hospital. Summary Syphilis is an infection that can spread through sexual contact or to a fetus during pregnancy. This condition can cause serious complications, so it is best to get treatment right away. The condition can be cured with antibiotic medicine. Take your antibiotic medicine as told by your health care provider. Tell your recent sexual partners that you were diagnosed with syphilis. It is important that they get treatment, even if they do not have symptoms. This information is not intended to replace advice given to you by your health care provider. Make sure you discuss any questions you have with your health care provider. Document Revised: 03/26/2021 Document Reviewed: 03/26/2021 Elsevier Patient Education  [place].

## 2022-06-24 NOTE — Progress Notes (Unsigned)
Subjective  CC:  Chief Complaint  Patient presents with   lab Results    HPI: Randy Armstrong is a 22 y.o. male who presents to the office today to address the problems listed above in the chief complaint. 22 yo male with new results showing + RPR and + confirmatory testing and + HIV here to discuss results. Former partner was diagnosed last month and is now on treatment. Pt reports he is asymptomatic. Handling new dx as expected, concerned but coping well overall. Reports a good support system. Lives with his parents, supportive but unaware of his sexuality.   Assessment  1. HIV antibody positive ([place])   2. Syphilis (acquired)      Plan  HIV:  early, refer urgently to ID to get evaluated and treated. Gave support information to [facility] for support counseling/groups.  Treat for early syphyllis: bicillin la 2.4 mg IM given. Reportable to HD.   Follow up: prn  Visit date not found  Orders Placed This Encounter  Procedures   CBC with Differential/Platelet   HIV 1 RNA quant-no reflex-bld   Ambulatory referral to Infectious Disease   Meds ordered this encounter  Medications   penicillin g benzathine (BICILLIN LA) 1200000 UNIT/2ML injection 1.2 Million Units    Order Specific Question:   Antibiotic Indication:    Answer:   Syphilis   penicillin g benzathine (BICILLIN LA) 1200000 UNIT/2ML injection 1.2 Million Units    Order Specific Question:   Antibiotic Indication:    Answer:   Syphilis      I reviewed the patients updated PMH, FH, and SocHx.    Patient Active Problem List   Diagnosis Date Noted   Pustular acne 07/03/2019   Seasonal allergic rhinitis due to pollen 04/22/2019   Childhood asthma, mild intermittent, uncomplicated [id number]   Current Meds  Medication Sig   Adapalene-Benzoyl Peroxide 0.1-2.5 % gel Apply to face nightly   fluticasone (FLONASE) 50 MCG/ACT nasal spray Place 1 spray into both nostrils daily.   ondansetron (ZOFRAN-ODT) 4 MG  disintegrating tablet Take 1 tablet (4 mg total) by mouth every 8 (eight) hours as needed for nausea or vomiting.    Allergies: Patient is allergic to diphenhydramine hcl. Family History: Patient family history includes Arthritis in his maternal grandmother and paternal grandmother; Cancer in his mother; Hypertension in his maternal grandmother and paternal grandmother. Social History:  Patient  reports that he has never smoked. He has never used smokeless tobacco. He reports that he does not currently use drugs. He reports that he does not drink alcohol.  Review of Systems: Constitutional: Negative for fever malaise or anorexia Cardiovascular: negative for chest pain Respiratory: negative for SOB or persistent cough Gastrointestinal: negative for abdominal pain  Objective  Vitals: BP 110/60   Pulse 69   Temp 98.5 F (36.9 C)   Ht 6' 3"$  (1.905 m)   Wt 181 lb 12.8 oz (82.5 kg)   SpO2 98%   BMI 22.72 kg/m  General: no acute distress , A&Ox3 HEENT: PEERL, conjunctiva normal, neck is supple Cardiovascular:  RRR without murmur or gallop.  Respiratory:  Good breath sounds bilaterally, CTAB with normal respiratory effort Skin:  Warm, no rashes  Office Visit on 06/24/2022  Component Date Value Ref Range Status   WBC 06/24/2022 5.4  4.0 - 10.5 K/uL Final   RBC 06/24/2022 4.24  4.22 - 5.81 Mil/uL Final   Hemoglobin 06/24/2022 13.7  13.0 - 17.0 g/dL Final   HCT 06/24/2022  40.5  39.0 - 52.0 % Final   MCV 06/24/2022 95.4  78.0 - 100.0 fl Final   MCHC 06/24/2022 33.9  30.0 - 36.0 g/dL Final   RDW 06/24/2022 12.0  11.5 - 15.5 % Final   Platelets 06/24/2022 304.0  150.0 - 400.0 K/uL Final   Neutrophils Relative % 06/24/2022 24.5 (L)  43.0 - 77.0 % Final   Lymphocytes Relative 06/24/2022 63.5 Repeated and verified X2. (H)  12.0 - 46.0 % Final   Monocytes Relative 06/24/2022 8.9  3.0 - 12.0 % Final   Eosinophils Relative 06/24/2022 2.5  0.0 - 5.0 % Final   Basophils Relative 06/24/2022  0.6  0.0 - 3.0 % Final   Neutro Abs 06/24/2022 1.3 (L)  1.4 - 7.7 K/uL Final   Lymphs Abs 06/24/2022 3.5  0.7 - 4.0 K/uL Final   Monocytes Absolute 06/24/2022 0.5  0.1 - 1.0 K/uL Final   Eosinophils Absolute 06/24/2022 0.1  0.0 - 0.7 K/uL Final   Basophils Absolute 06/24/2022 0.0  0.0 - 0.1 K/uL Final   HIV 1 RNA Quant 06/24/2022 3,070 (H)  Copies/mL Final   HIV-1 RNA Quant, Log 06/24/2022 3.49 (H)  Log cps/mL Final   + RPR and nontrepnemal confirmatory test [facility] side effects, risks, benefits, and alternatives for medications and treatment plan prescribed today were discussed, and the patient expressed understanding of the given instructions. Patient is instructed to call or message via MyChart if he/she has any questions or concerns regarding our treatment plan. No barriers to understanding were identified. We discussed Red Flag symptoms and signs in detail. Patient expressed understanding regarding what to do in case of urgent or emergency type symptoms.  Medication list was reconciled, printed and provided to the patient in AVS. Patient instructions and summary information was reviewed with the patient as documented in the AVS. This note was prepared with assistance of Dragon voice recognition software. Occasional wrong-word or sound-a-like substitutions may have occurred due to the inherent limitations of voice recognition software

## 2022-06-26 LAB — HIV-1 RNA QUANT-NO REFLEX-BLD
HIV 1 RNA Quant: 3070 Copies/mL — ABNORMAL HIGH
HIV-1 RNA Quant, Log: 3.49 Log cps/mL — ABNORMAL HIGH

## 2022-06-28 ENCOUNTER — Other Ambulatory Visit (HOSPITAL_COMMUNITY): Payer: Self-pay

## 2022-06-28 ENCOUNTER — Telehealth: Payer: Self-pay

## 2022-06-28 NOTE — Telephone Encounter (Signed)
[facility] Patient Teacher, English as a foreign language completed.    The patient is insured through [employer] and has a $250.00 copay.  Patient will need a copay coupon card.  We will continue to follow to see if copay assistance is needed.  Ileene Patrick, [facility] Patient [facility] for Infectious Disease Phone: [phone] Fax:  [phone]

## 2022-06-29 ENCOUNTER — Ambulatory Visit: Payer: 59 | Admitting: Family Medicine

## 2022-06-29 ENCOUNTER — Ambulatory Visit: Payer: 59

## 2022-06-29 ENCOUNTER — Other Ambulatory Visit: Payer: Self-pay

## 2022-06-29 ENCOUNTER — Encounter: Payer: Self-pay | Admitting: Internal Medicine

## 2022-06-29 ENCOUNTER — Ambulatory Visit (INDEPENDENT_AMBULATORY_CARE_PROVIDER_SITE_OTHER): Payer: 59 | Admitting: Pharmacist

## 2022-06-29 ENCOUNTER — Other Ambulatory Visit (HOSPITAL_COMMUNITY): Payer: Self-pay

## 2022-06-29 ENCOUNTER — Ambulatory Visit: Payer: 59 | Admitting: Internal Medicine

## 2022-06-29 VITALS — BP 115/66 | HR 62 | Temp 98.0°F | Ht 74.0 in | Wt 182.0 lb

## 2022-06-29 DIAGNOSIS — Z79899 Other long term (current) drug therapy: Secondary | ICD-10-CM | POA: Diagnosis not present

## 2022-06-29 DIAGNOSIS — Z113 Encounter for screening for infections with a predominantly sexual mode of transmission: Secondary | ICD-10-CM | POA: Diagnosis not present

## 2022-06-29 DIAGNOSIS — B20 Human immunodeficiency virus [HIV] disease: Secondary | ICD-10-CM | POA: Diagnosis not present

## 2022-06-29 MED ORDER — BICTEGRAVIR-EMTRICITAB-TENOFOV 50-200-25 MG PO TABS
1.0000 | ORAL_TABLET | Freq: Every day | ORAL | 11 refills | Status: DC
  Filled 2022-06-29 – 2022-06-30 (×2): qty 30, 30d supply, fill #0
  Filled 2022-07-18: qty 30, 30d supply, fill #1
  Filled 2022-08-12: qty 30, 30d supply, fill #2
  Filled 2022-09-12: qty 30, 30d supply, fill #3
  Filled 2022-10-13: qty 30, 30d supply, fill #4

## 2022-06-29 NOTE — Progress Notes (Signed)
06/29/2022  HPI: Randy Armstrong is a 22 y.o. male who presents to the [facility] clinic today to initiate care for a newly diagnosed HIV infection.  Patient Active Problem List   Diagnosis Date Noted   Human immunodeficiency virus (HIV) disease ([place]) 06/29/2022   Routine screening for STI (sexually transmitted infection) 06/29/2022   Encounter for long-term (current) use of high-risk medication 06/29/2022   Pustular acne 07/03/2019   Seasonal allergic rhinitis due to pollen 04/22/2019   Childhood asthma, mild intermittent, uncomplicated [id number]    Patient's Medications  New Prescriptions   No medications on file  Previous Medications   ADAPALENE-BENZOYL PEROXIDE 0.1-2.5 % GEL    Apply to face nightly   ALBUTEROL (VENTOLIN HFA) 108 (90 BASE) MCG/ACT INHALER    Inhale 2 puffs into the lungs every 4 (four) hours as needed for wheezing or shortness of breath.   CHLORHEXIDINE (PERIDEX) 0.12 % SOLUTION    15 mLs 2 (two) times daily.   FLUTICASONE (FLONASE) 50 MCG/ACT NASAL SPRAY    Place 1 spray into both nostrils daily.   MULTIPLE VITAMINS-MINERALS (MULTIVITAMIN ADULTS) TABS    Take 1 tablet by mouth daily.   ONDANSETRON (ZOFRAN-ODT) 4 MG DISINTEGRATING TABLET    Take 1 tablet (4 mg total) by mouth every 8 (eight) hours as needed for nausea or vomiting.   SODIUM FLUORIDE 5000 PLUS 1.1 % CREA DENTAL CREAM    SMARTSIG:Topical  Modified Medications   No medications on file  Discontinued Medications   No medications on file    Allergies: Allergies  Allergen Reactions   Diphenhydramine Hcl     REACTION: itching    Past Medical History: Past Medical History:  Diagnosis Date   Asthma    Seasonal allergic rhinitis due to pollen 04/22/2019    Social History: Social History   Socioeconomic History   Marital status: Single    Spouse name: Not on file   Number of children: Not on file   Years of education: Not on file   Highest education level: Not on file  Occupational History    Not on file  Tobacco Use   Smoking status: Never   Smokeless tobacco: Never  Substance and Sexual Activity   Alcohol use: No   Drug use: Not Currently   Sexual activity: Not Currently    Comment: declined condoms  Other Topics Concern   Not on file  Social History Narrative   Not on file   Social Determinants of Health   Financial Resource Strain: Not on file  Food Insecurity: Not on file  Transportation Needs: Not on file  Physical Activity: Not on file  Stress: Not on file  Social Connections: Not on file    Labs: Lab Results  Component Value Date   HIV1RNAQUANT 3,070 (H) 06/24/2022    RPR and STI Lab Results  Component Value Date   LABRPR REACTIVE (A) 06/20/2022   RPRTITER 1:16 (H) 06/20/2022    STI Results GC CT  06/20/2022  4:51 PM Negative  Negative     Hepatitis B No results found for: "HEPBSAB", "HEPBSAG", "HEPBCAB" Hepatitis C Lab Results  Component Value Date   HEPCAB NON-REACTIVE 06/20/2022   Hepatitis A No results found for: "HAV" Lipids: No results found for: "CHOL", "TRIG", "HDL", "CHOLHDL", "VLDL", "LDLCALC"  Current HIV Regimen: Treatment naive  Assessment: Randy Armstrong is here today to initiate care with Dr. Linus Salmons for newly diagnosed HIV infection. He is treatment naive with an initial HIV viral load  of 3,070. No resistance mutations found on initial genotype. Will start patient on [place]. Explained that Randy Armstrong is a one pill once daily medication with or without food and the importance of not missing any doses. Explained resistance and how it develops and why it is so important to take Biktarvy daily and not skip days or doses. Cautioned on possible side effects the first week or so including nausea, diarrhea, dizziness, and headaches but that they should resolve after the first couple of weeks. Counseled to separate Biktarvy from divalent cations including multivitamins. Discussed to call clinic or send a MyChart message if he starts a new  medication or herbal supplement. Answered all questions. His copay is currently $250/mo, Butch Penny will reach out with a copay card to bring this down.   Plan: - Start Biktarvy 1 tab PO daily - Follow up in 4 weeks  - Counseled to reach out with any questions or concerns that may arise  Titus Dubin, PharmD PGY1 Pharmacy Resident 06/29/2022 3:21 PM

## 2022-06-29 NOTE — Progress Notes (Unsigned)
[facility]      Reason for Consult: New B20   Referring Physician: Dr. Jonni Sanger    Patient ID: Randy Armstrong, male    DOB: [birth year], 22 y.o.   MRN: [id number]  HPI:   Randy Armstrong is here to establish care for HIV He was diagnosed by his PCP during routing care.  He does not remember a previous test.  His initial viral load is 3,070.  He endorses MSM, sex with male and females.  He is having no weight loss, no diarrhea, no rashes.  He has a support system to help him through this.    Past Medical History:  Diagnosis Date   Asthma    Seasonal allergic rhinitis due to pollen 04/22/2019    Prior to Admission medications   Medication Sig Start Date End Date Taking? Authorizing Provider  Adapalene-Benzoyl Peroxide 0.1-2.5 % gel Apply to face nightly Patient not taking: Reported on 06/29/2022 04/22/19   Leamon Arnt, MD  albuterol (VENTOLIN HFA) 108 (90 Base) MCG/ACT inhaler Inhale 2 puffs into the lungs every 4 (four) hours as needed for wheezing or shortness of breath. Patient not taking: Reported on 06/24/2022 04/22/19   Leamon Arnt, MD  chlorhexidine (PERIDEX) 0.12 % solution 15 mLs 2 (two) times daily. Patient not taking: Reported on 06/24/2022 05/20/21   [provider]  fluticasone (FLONASE) 50 MCG/ACT nasal spray Place 1 spray into both nostrils daily. Patient not taking: Reported on 06/29/2022 08/18/21   Leamon Arnt, MD  Multiple Vitamins-Minerals (MULTIVITAMIN ADULTS) TABS Take 1 tablet by mouth daily. Patient not taking: Reported on 06/24/2022    [provider]  ondansetron (ZOFRAN-ODT) 4 MG disintegrating tablet Take 1 tablet (4 mg total) by mouth every 8 (eight) hours as needed for nausea or vomiting. Patient not taking: Reported on 06/29/2022 08/07/21   Eulogio Bear, NP  SODIUM FLUORIDE 5000 PLUS 1.1 % CREA dental cream SMARTSIG:Topical Patient not taking: Reported on 06/24/2022 05/20/21   [provider]    Allergies   Allergen Reactions   Diphenhydramine Hcl     REACTION: itching    Social History   Tobacco Use   Smoking status: Never   Smokeless tobacco: Never  Substance Use Topics   Alcohol use: No   Drug use: Not Currently    Family History  Problem Relation Age of Onset   Cancer Mother    Arthritis Maternal Grandmother    Hypertension Maternal Grandmother    Arthritis Paternal Grandmother    Hypertension Paternal Grandmother     Review of Systems  Constitutional: negative for sweats, fatigue, malaise, and anorexia All other systems reviewed and are negative    Constitutional: in no apparent distress  Vitals:   06/29/22 1426  BP: 115/66  Pulse: 62  Temp: 98 F (36.7 C)  SpO2: 97%   EYES: anicteric Respiratory: normal respiratory effort GI: soft, nt  Labs: Lab Results  Component Value Date   WBC 5.4 06/24/2022   HGB 13.7 06/24/2022   HCT 40.5 06/24/2022   MCV 95.4 06/24/2022   PLT 304.0 06/24/2022    Lab Results  Component Value Date   CREATININE 0.82 08/07/2021   BUN 6 08/07/2021   NA 138 08/07/2021   K 3.8 08/07/2021   CL 107 08/07/2021   CO2 23 08/07/2021    Lab Results  Component Value Date   ALT 24 08/07/2021   AST 30 08/07/2021   ALKPHOS 79 08/07/2021  BILITOT 0.8 08/07/2021     Assessment: New HIV.  I discussed the care and management of someone with HIV>  I discussed treatment options.  I discussed good prognosis for those that remain in care.  All questions answered.   Plan: 1)  labs  2) pneumonia vaccine 3) offered condoms 4) start Biktarvy Follow up in 4 weeks

## 2022-06-30 ENCOUNTER — Other Ambulatory Visit: Payer: Self-pay

## 2022-06-30 ENCOUNTER — Encounter: Payer: Self-pay | Admitting: Internal Medicine

## 2022-06-30 ENCOUNTER — Telehealth: Payer: Self-pay

## 2022-06-30 ENCOUNTER — Other Ambulatory Visit (HOSPITAL_COMMUNITY): Payer: Self-pay

## 2022-06-30 LAB — COMPLETE METABOLIC PANEL WITH GFR
AG Ratio: 1.1 (calc) (ref 1.0–2.5)
ALT: 15 U/L (ref 9–46)
AST: 20 U/L (ref 10–40)
Albumin: 4.5 g/dL (ref 3.6–5.1)
Alkaline phosphatase (APISO): 102 U/L (ref 36–130)
BUN: 7 mg/dL (ref 7–25)
CO2: 26 mmol/L (ref 20–32)
Calcium: 9.2 mg/dL (ref 8.6–10.3)
Chloride: 106 mmol/L (ref 98–110)
Creat: 0.79 mg/dL (ref 0.60–1.24)
Globulin: 4.2 g/dL (calc) — ABNORMAL HIGH (ref 1.9–3.7)
Glucose, Bld: 86 mg/dL (ref 65–99)
Potassium: 4.3 mmol/L (ref 3.5–5.3)
Sodium: 139 mmol/L (ref 135–146)
Total Bilirubin: 0.4 mg/dL (ref 0.2–1.2)
Total Protein: 8.7 g/dL — ABNORMAL HIGH (ref 6.1–8.1)
eGFR: 128 mL/min/{1.73_m2} (ref >=60)

## 2022-06-30 LAB — HEPATITIS B CORE ANTIBODY, TOTAL: Hep B Core Total Ab: NONREACTIVE

## 2022-06-30 LAB — URINALYSIS, ROUTINE W REFLEX MICROSCOPIC
Bacteria, UA: NONE SEEN /HPF
Bilirubin Urine: NEGATIVE
Glucose, UA: NEGATIVE
Hgb urine dipstick: NEGATIVE
Hyaline Cast: NONE SEEN /LPF
Leukocytes,Ua: NEGATIVE
Nitrite: NEGATIVE
RBC / HPF: NONE SEEN /HPF (ref 0–2)
Specific Gravity, Urine: 1.028 (ref 1.001–1.035)
Squamous Epithelial / HPF: NONE SEEN /HPF (ref <=5)
WBC, UA: NONE SEEN /HPF (ref 0–5)
pH: 6.5 (ref 5.0–8.0)

## 2022-06-30 LAB — CBC WITH DIFFERENTIAL/PLATELET
Absolute Monocytes: 600 cells/uL (ref 200–950)
Basophils Absolute: 30 cells/uL (ref 0–200)
Basophils Relative: 0.5 %
Eosinophils Absolute: 306 cells/uL (ref 15–500)
Eosinophils Relative: 5.1 %
HCT: 43.1 % (ref 38.5–50.0)
Hemoglobin: 14.4 g/dL (ref 13.2–17.1)
Lymphs Abs: 3072 cells/uL (ref 850–3900)
MCH: 31.4 pg (ref 27.0–33.0)
MCHC: 33.4 g/dL (ref 32.0–36.0)
MCV: 94.1 fL (ref 80.0–100.0)
MPV: 9.7 fL (ref 7.5–12.5)
Monocytes Relative: 10 %
Neutro Abs: 1992 cells/uL (ref 1500–7800)
Neutrophils Relative %: 33.2 %
Platelets: 356 10*3/uL (ref 140–400)
RBC: 4.58 10*6/uL (ref 4.20–5.80)
RDW: 11.5 % (ref 11.0–15.0)
Total Lymphocyte: 51.2 %
WBC: 6 10*3/uL (ref 3.8–10.8)

## 2022-06-30 LAB — T-HELPER CELL (CD4) - (RCID CLINIC ONLY)
CD4 % Helper T Cell: 18 % — ABNORMAL LOW (ref 33–65)
CD4 T Cell Abs: 474 /uL (ref 400–1790)

## 2022-06-30 LAB — LIPID PANEL
Cholesterol: 142 mg/dL (ref <200)
HDL: 42 mg/dL (ref >=40)
LDL Cholesterol (Calc): 82 mg/dL (calc)
Non-HDL Cholesterol (Calc): 100 mg/dL (calc) (ref <130)
Total CHOL/HDL Ratio: 3.4 (calc) (ref <5.0)
Triglycerides: 85 mg/dL (ref <150)

## 2022-06-30 LAB — HEPATITIS A ANTIBODY, TOTAL: Hepatitis A AB,Total: NONREACTIVE

## 2022-06-30 LAB — HEPATITIS B SURFACE ANTIGEN: Hepatitis B Surface Ag: NONREACTIVE

## 2022-06-30 LAB — HEPATITIS B SURFACE ANTIBODY,QUALITATIVE: Hep B S Ab: NONREACTIVE

## 2022-06-30 NOTE — Telephone Encounter (Signed)
[facility] Patient Advocate Encounter °  °Was successful in obtaining a [employer] copay card for Biktarvy.  This copay card will make the patients copay 0.00. ° °I have spoken with the patient.   ° °The billing information is as follows and has been shared with [facility]. ° ° ° ° ° ° ° °Roseanne Juenger, CPhT °Specialty Pharmacy Patient Advocate °[facility] °Phone: [phone] °Fax:  [phone]  °

## 2022-07-13 ENCOUNTER — Encounter: Payer: Self-pay | Admitting: Internal Medicine

## 2022-07-18 ENCOUNTER — Other Ambulatory Visit (HOSPITAL_COMMUNITY): Payer: Self-pay

## 2022-07-20 ENCOUNTER — Telehealth: Payer: Self-pay | Admitting: Family Medicine

## 2022-07-20 ENCOUNTER — Telehealth: Payer: Self-pay

## 2022-07-20 NOTE — Telephone Encounter (Signed)
Got a call from [facility] and they wanted to know the confirmation of the T pallidum results   Randy Armstrong [phone]  [facility]

## 2022-07-20 NOTE — Telephone Encounter (Signed)
Tiana with [place] [facility] is requesting a call back regarding pt.  [phone]

## 2022-07-22 ENCOUNTER — Other Ambulatory Visit (HOSPITAL_COMMUNITY): Payer: Self-pay

## 2022-07-27 ENCOUNTER — Other Ambulatory Visit: Payer: Self-pay

## 2022-07-27 ENCOUNTER — Encounter: Payer: Self-pay | Admitting: Internal Medicine

## 2022-07-27 ENCOUNTER — Ambulatory Visit (INDEPENDENT_AMBULATORY_CARE_PROVIDER_SITE_OTHER): Payer: 59 | Admitting: Internal Medicine

## 2022-07-27 VITALS — BP 109/65 | HR 64 | Resp 16 | Ht 74.0 in | Wt 189.0 lb

## 2022-07-27 DIAGNOSIS — Z7185 Encounter for immunization safety counseling: Secondary | ICD-10-CM | POA: Diagnosis not present

## 2022-07-27 DIAGNOSIS — B20 Human immunodeficiency virus [HIV] disease: Secondary | ICD-10-CM

## 2022-07-27 DIAGNOSIS — Z23 Encounter for immunization: Secondary | ICD-10-CM | POA: Diagnosis not present

## 2022-07-27 NOTE — Assessment & Plan Note (Signed)
Doing well on Biktarvy and will continue.  Labs today to check his viral load.   Follow up in 3 months

## 2022-07-27 NOTE — Assessment & Plan Note (Signed)
Discussed the HPV series and started today

## 2022-07-27 NOTE — Progress Notes (Signed)
   Subjective:    Patient ID: Randy Armstrong, male    DOB: November 20, 2000, 22 y.o.   MRN: [id number]  HPI Randy Armstrong is here for follow up of HIV He started [facility] after his initial visit and tolerating well.  No missed doses and good tolerance after some initial mild nausea.  He is hepatitis A and B non-immune though immunized as a child.      Review of Systems  Constitutional:  Negative for fatigue.  Gastrointestinal:  Negative for diarrhea.  Skin:  Negative for rash.       Objective:   Physical Exam Eyes:     General: No scleral icterus. Pulmonary:     Effort: Pulmonary effort is normal.  Neurological:     Mental Status: He is alert.   SH: no tobacco        Assessment & Plan:

## 2022-07-27 NOTE — Addendum Note (Signed)
Addended by: Theresia Majors A on: 07/27/2022 04:22 PM   Modules accepted: Orders

## 2022-07-27 NOTE — Assessment & Plan Note (Signed)
Will start the hepatitis B series today, will defer hepatitis A series until next visit Also Tdap

## 2022-07-28 LAB — T-HELPER CELL (CD4) - (RCID CLINIC ONLY)
CD4 % Helper T Cell: 18 % — ABNORMAL LOW (ref 33–65)
CD4 T Cell Abs: 599 /uL (ref 400–1790)

## 2022-07-29 LAB — COMPLETE METABOLIC PANEL WITH GFR
AG Ratio: 1.2 (calc) (ref 1.0–2.5)
ALT: 16 U/L (ref 9–46)
AST: 22 U/L (ref 10–40)
Albumin: 4.3 g/dL (ref 3.6–5.1)
Alkaline phosphatase (APISO): 97 U/L (ref 36–130)
BUN: 9 mg/dL (ref 7–25)
CO2: 27 mmol/L (ref 20–32)
Calcium: 9.2 mg/dL (ref 8.6–10.3)
Chloride: 108 mmol/L (ref 98–110)
Creat: 0.81 mg/dL (ref 0.60–1.24)
Globulin: 3.6 g/dL (calc) (ref 1.9–3.7)
Glucose, Bld: 103 mg/dL — ABNORMAL HIGH (ref 65–99)
Potassium: 4.3 mmol/L (ref 3.5–5.3)
Sodium: 140 mmol/L (ref 135–146)
Total Bilirubin: 0.5 mg/dL (ref 0.2–1.2)
Total Protein: 7.9 g/dL (ref 6.1–8.1)
eGFR: 127 mL/min/{1.73_m2} (ref >=60)

## 2022-07-29 LAB — HIV-1 RNA QUANT-NO REFLEX-BLD
HIV 1 RNA Quant: NOT DETECTED Copies/mL
HIV-1 RNA Quant, Log: NOT DETECTED Log cps/mL

## 2022-08-12 ENCOUNTER — Other Ambulatory Visit (HOSPITAL_COMMUNITY): Payer: Self-pay

## 2022-08-16 ENCOUNTER — Other Ambulatory Visit (HOSPITAL_COMMUNITY): Payer: Self-pay

## 2022-08-17 ENCOUNTER — Other Ambulatory Visit (HOSPITAL_BASED_OUTPATIENT_CLINIC_OR_DEPARTMENT_OTHER): Payer: Self-pay

## 2022-08-19 ENCOUNTER — Other Ambulatory Visit (HOSPITAL_COMMUNITY): Payer: Self-pay

## 2022-08-22 ENCOUNTER — Other Ambulatory Visit (HOSPITAL_COMMUNITY): Payer: Self-pay

## 2022-08-26 ENCOUNTER — Other Ambulatory Visit (HOSPITAL_COMMUNITY): Payer: Self-pay

## 2022-09-09 ENCOUNTER — Other Ambulatory Visit: Payer: Self-pay

## 2022-09-12 ENCOUNTER — Other Ambulatory Visit: Payer: Self-pay

## 2022-09-12 ENCOUNTER — Other Ambulatory Visit (HOSPITAL_COMMUNITY): Payer: Self-pay

## 2022-09-20 ENCOUNTER — Other Ambulatory Visit: Payer: Self-pay

## 2022-10-06 ENCOUNTER — Other Ambulatory Visit (HOSPITAL_COMMUNITY): Payer: Self-pay

## 2022-10-13 ENCOUNTER — Other Ambulatory Visit: Payer: Self-pay

## 2022-10-18 ENCOUNTER — Other Ambulatory Visit (HOSPITAL_COMMUNITY)
Admission: RE | Admit: 2022-10-18 | Discharge: 2022-10-18 | Disposition: A | Discharge status: Home / Self Care | Payer: 59 | Source: Ambulatory Visit | Attending: Internal Medicine | Admitting: Internal Medicine

## 2022-10-18 ENCOUNTER — Other Ambulatory Visit: Payer: Self-pay

## 2022-10-18 ENCOUNTER — Encounter: Payer: Self-pay | Admitting: Internal Medicine

## 2022-10-18 ENCOUNTER — Other Ambulatory Visit (HOSPITAL_COMMUNITY): Payer: Self-pay

## 2022-10-18 ENCOUNTER — Ambulatory Visit: Payer: 59 | Admitting: Internal Medicine

## 2022-10-18 VITALS — BP 125/75 | HR 64 | Temp 97.6°F | Ht 74.0 in | Wt 184.0 lb

## 2022-10-18 DIAGNOSIS — A539 Syphilis, unspecified: Secondary | ICD-10-CM

## 2022-10-18 DIAGNOSIS — Z113 Encounter for screening for infections with a predominantly sexual mode of transmission: Secondary | ICD-10-CM | POA: Diagnosis not present

## 2022-10-18 DIAGNOSIS — B20 Human immunodeficiency virus [HIV] disease: Secondary | ICD-10-CM

## 2022-10-18 DIAGNOSIS — Z23 Encounter for immunization: Secondary | ICD-10-CM | POA: Diagnosis not present

## 2022-10-18 MED ORDER — BICTEGRAVIR-EMTRICITAB-TENOFOV 50-200-25 MG PO TABS
1.0000 | ORAL_TABLET | Freq: Every day | ORAL | 11 refills | Status: DC
  Filled 2022-10-18: qty 30, 30d supply, fill #0
  Filled 2022-11-15: qty 30, 30d supply, fill #1
  Filled 2022-12-06: qty 30, 30d supply, fill #2
  Filled 2023-01-04: qty 30, 30d supply, fill #3
  Filled 2023-02-01: qty 30, 30d supply, fill #4
  Filled 2023-03-08: qty 30, 30d supply, fill #5
  Filled 2023-04-06: qty 30, 30d supply, fill #6
  Filled 2023-05-12: qty 30, 30d supply, fill #7
  Filled 2023-06-14: qty 30, 30d supply, fill #8
  Filled 2023-07-10: qty 30, 30d supply, fill #9
  Filled 2023-08-09: qty 30, 30d supply, fill #10
  Filled 2023-09-14: qty 30, 30d supply, fill #11

## 2022-10-18 MED ORDER — PENICILLIN G BENZATHINE 1200000 UNIT/2ML IM SUSY
1.2000 10*6.[IU] | PREFILLED_SYRINGE | Freq: Once | INTRAMUSCULAR | Status: AC
  Administered 2022-10-18: 1.2 10*6.[IU] via INTRAMUSCULAR

## 2022-10-18 NOTE — Assessment & Plan Note (Signed)
Will continue his treatment today and last injection in 1 week.  Will recheck his titer today

## 2022-10-18 NOTE — Assessment & Plan Note (Signed)
He is doing well with biktarvy and no issues.  Refills provided.  Follow up in 4 months.

## 2022-10-18 NOTE — Assessment & Plan Note (Signed)
Will screen today 

## 2022-10-18 NOTE — Assessment & Plan Note (Signed)
Hepatitis B #2

## 2022-10-18 NOTE — Progress Notes (Unsigned)
   Subjective:    Patient ID: AREG CHEY, male    DOB: November 06, 2000, 22 y.o.   MRN: [id number]  HPI Dearius is here for follow up of HIV He continues on [place] after starting earlier this year.  No issues with getting or taking his medication.  Last viral load was not detected.  No complaints today.    Review of Systems  Constitutional:  Negative for fatigue.  Gastrointestinal:  Negative for diarrhea.  Skin:  Negative for rash.       Objective:   Physical Exam Eyes:     General: No scleral icterus. Pulmonary:     Effort: Pulmonary effort is normal.  Skin:    Findings: No rash.  Neurological:     Mental Status: He is alert.   SH: no tobacco        Assessment & Plan:

## 2022-10-19 ENCOUNTER — Other Ambulatory Visit (HOSPITAL_COMMUNITY): Payer: Self-pay

## 2022-10-19 ENCOUNTER — Other Ambulatory Visit: Payer: Self-pay

## 2022-10-20 ENCOUNTER — Telehealth: Payer: Self-pay

## 2022-10-20 LAB — CYTOLOGY, (ORAL, ANAL, URETHRAL) ANCILLARY ONLY
Chlamydia: NEGATIVE
Chlamydia: POSITIVE — AB
Comment: NEGATIVE
Comment: NEGATIVE
Comment: NORMAL
Comment: NORMAL
Neisseria Gonorrhea: NEGATIVE
Neisseria Gonorrhea: POSITIVE — AB

## 2022-10-20 LAB — URINE CYTOLOGY ANCILLARY ONLY
Chlamydia: NEGATIVE
Comment: NEGATIVE
Comment: NORMAL
Neisseria Gonorrhea: NEGATIVE

## 2022-10-20 NOTE — Telephone Encounter (Signed)
Unable to reach pt left vm to contact office. 

## 2022-10-20 NOTE — Telephone Encounter (Signed)
-----   Message from Gardiner Barefoot, MD sent at 10/20/2022  2:29 PM EDT ----- Please let him know he is gonorrhea and chlamydia positive and needs ceftriaxone 500 mg IM x 1 and azithromycin 1000 mg po x 1  thanks!

## 2022-10-20 NOTE — Telephone Encounter (Signed)
Patient is scheduled for treatment on 6/7.

## 2022-10-21 ENCOUNTER — Ambulatory Visit (INDEPENDENT_AMBULATORY_CARE_PROVIDER_SITE_OTHER): Payer: 59

## 2022-10-21 ENCOUNTER — Other Ambulatory Visit: Payer: Self-pay

## 2022-10-21 DIAGNOSIS — A549 Gonococcal infection, unspecified: Secondary | ICD-10-CM | POA: Diagnosis not present

## 2022-10-21 DIAGNOSIS — A749 Chlamydial infection, unspecified: Secondary | ICD-10-CM | POA: Diagnosis not present

## 2022-10-21 LAB — HIV-1 RNA QUANT-NO REFLEX-BLD
HIV 1 RNA Quant: NOT DETECTED Copies/mL
HIV-1 RNA Quant, Log: NOT DETECTED Log cps/mL

## 2022-10-21 LAB — RPR TITER: RPR Titer: 1:4 {titer} — ABNORMAL HIGH

## 2022-10-21 LAB — T PALLIDUM AB: T Pallidum Abs: POSITIVE — AB

## 2022-10-21 LAB — RPR: RPR Ser Ql: REACTIVE — AB

## 2022-10-21 MED ORDER — AZITHROMYCIN 250 MG PO TABS
1000.0000 mg | ORAL_TABLET | Freq: Once | ORAL | Status: AC
Start: 2022-10-21 — End: 2022-10-21
  Administered 2022-10-21: 1000 mg via ORAL

## 2022-10-21 MED ORDER — CEFTRIAXONE SODIUM 500 MG IJ SOLR
500.0000 mg | Freq: Once | INTRAMUSCULAR | Status: AC
Start: 2022-10-21 — End: 2022-10-21
  Administered 2022-10-21: 500 mg via INTRAMUSCULAR

## 2022-10-25 ENCOUNTER — Other Ambulatory Visit: Payer: Self-pay

## 2022-10-25 ENCOUNTER — Ambulatory Visit (INDEPENDENT_AMBULATORY_CARE_PROVIDER_SITE_OTHER): Payer: 59

## 2022-10-25 DIAGNOSIS — A539 Syphilis, unspecified: Secondary | ICD-10-CM | POA: Diagnosis not present

## 2022-10-25 MED ORDER — PENICILLIN G BENZATHINE 1200000 UNIT/2ML IM SUSY
2.4000 10*6.[IU] | PREFILLED_SYRINGE | Freq: Once | INTRAMUSCULAR | Status: AC
Start: 2022-10-25 — End: 2022-10-25
  Administered 2022-10-25: 2.4 10*6.[IU] via INTRAMUSCULAR

## 2022-10-25 NOTE — Progress Notes (Addendum)
Staff reviewed labs and allergies, offered condoms, advised patient to remain abstinent for 7-10 days after treatment. Patient's questions answered to their satisfaction. Valarie Cones, LPN

## 2022-11-15 ENCOUNTER — Other Ambulatory Visit (HOSPITAL_COMMUNITY): Payer: Self-pay

## 2022-11-15 ENCOUNTER — Other Ambulatory Visit: Payer: Self-pay

## 2022-11-24 ENCOUNTER — Other Ambulatory Visit: Payer: Self-pay

## 2022-12-06 ENCOUNTER — Other Ambulatory Visit: Payer: Self-pay

## 2022-12-06 ENCOUNTER — Other Ambulatory Visit (HOSPITAL_BASED_OUTPATIENT_CLINIC_OR_DEPARTMENT_OTHER): Payer: Self-pay

## 2022-12-07 ENCOUNTER — Other Ambulatory Visit (HOSPITAL_COMMUNITY): Payer: Self-pay

## 2022-12-08 ENCOUNTER — Other Ambulatory Visit (HOSPITAL_COMMUNITY): Payer: Self-pay

## 2022-12-09 ENCOUNTER — Other Ambulatory Visit (HOSPITAL_COMMUNITY): Payer: Self-pay

## 2022-12-09 ENCOUNTER — Other Ambulatory Visit: Payer: Self-pay

## 2022-12-27 ENCOUNTER — Other Ambulatory Visit: Payer: Self-pay

## 2023-01-04 ENCOUNTER — Other Ambulatory Visit (HOSPITAL_COMMUNITY): Payer: Self-pay

## 2023-01-11 ENCOUNTER — Other Ambulatory Visit (HOSPITAL_COMMUNITY): Payer: Self-pay

## 2023-02-01 ENCOUNTER — Other Ambulatory Visit (HOSPITAL_COMMUNITY): Payer: Self-pay

## 2023-02-07 ENCOUNTER — Other Ambulatory Visit (HOSPITAL_COMMUNITY): Payer: Self-pay

## 2023-02-07 ENCOUNTER — Other Ambulatory Visit: Payer: Self-pay

## 2023-02-07 MED ORDER — IBUPROFEN 800 MG PO TABS
800.0000 mg | ORAL_TABLET | Freq: Three times a day (TID) | ORAL | 0 refills | Status: DC
  Filled 2023-02-07 (×2): qty 30, 10d supply, fill #0

## 2023-02-07 MED ORDER — HYDROCODONE-ACETAMINOPHEN 5-325 MG PO TABS
1.0000 | ORAL_TABLET | Freq: Four times a day (QID) | ORAL | 0 refills | Status: DC | PRN
  Filled 2023-02-07 (×2): qty 12, 3d supply, fill #0

## 2023-02-08 ENCOUNTER — Other Ambulatory Visit: Payer: Self-pay

## 2023-02-21 ENCOUNTER — Other Ambulatory Visit: Payer: Self-pay

## 2023-02-21 ENCOUNTER — Other Ambulatory Visit (HOSPITAL_COMMUNITY)
Admission: RE | Admit: 2023-02-21 | Discharge: 2023-02-21 | Disposition: A | Discharge status: Home / Self Care | Payer: 59 | Source: Ambulatory Visit | Attending: Internal Medicine | Admitting: Internal Medicine

## 2023-02-21 ENCOUNTER — Ambulatory Visit: Payer: 59 | Admitting: Internal Medicine

## 2023-02-21 ENCOUNTER — Encounter: Payer: Self-pay | Admitting: Internal Medicine

## 2023-02-21 VITALS — BP 117/69 | HR 80 | Temp 98.4°F | Resp 16 | Wt 187.4 lb

## 2023-02-21 DIAGNOSIS — Z113 Encounter for screening for infections with a predominantly sexual mode of transmission: Secondary | ICD-10-CM | POA: Diagnosis not present

## 2023-02-21 DIAGNOSIS — B20 Human immunodeficiency virus [HIV] disease: Secondary | ICD-10-CM | POA: Insufficient documentation

## 2023-02-21 DIAGNOSIS — Z23 Encounter for immunization: Secondary | ICD-10-CM | POA: Insufficient documentation

## 2023-02-21 NOTE — Progress Notes (Signed)
   Subjective:    Patient ID: Randy Armstrong, male    DOB: 02-08-2001, 22 y.o.   MRN: [id number]  HPI Randy Armstrong is here for follow up of hIV He continues on Biktarvy with no missed doses.  No issues getting or taking his medication.  No concerns today.    Review of Systems  Constitutional:  Negative for fatigue.  Gastrointestinal:  Negative for diarrhea and nausea.  Skin:  Negative for rash.       Objective:   Physical Exam Eyes:     General: No scleral icterus. Pulmonary:     Effort: Pulmonary effort is normal.  Neurological:     Mental Status: He is alert.   SH: no tobacco        Assessment & Plan:

## 2023-02-21 NOTE — Assessment & Plan Note (Signed)
He is doing well, no concerns and will check his labs today.  Otherwise can follow up in 4 months per his request.

## 2023-02-21 NOTE — Assessment & Plan Note (Signed)
Will screen today 

## 2023-02-21 NOTE — Assessment & Plan Note (Signed)
Due for flu and COVID and he will do it with his work

## 2023-02-22 LAB — T-HELPER CELL (CD4) - (RCID CLINIC ONLY)
CD4 % Helper T Cell: 20 % — ABNORMAL LOW (ref 33–65)
CD4 T Cell Abs: 657 /uL (ref 400–1790)

## 2023-02-23 ENCOUNTER — Other Ambulatory Visit (HOSPITAL_BASED_OUTPATIENT_CLINIC_OR_DEPARTMENT_OTHER): Payer: Self-pay

## 2023-02-23 LAB — CYTOLOGY, (ORAL, ANAL, URETHRAL) ANCILLARY ONLY
Chlamydia: NEGATIVE
Chlamydia: NEGATIVE
Comment: NEGATIVE
Comment: NORMAL
Comment: NEGATIVE
Comment: NORMAL
Neisseria Gonorrhea: NEGATIVE
Neisseria Gonorrhea: NEGATIVE

## 2023-02-23 LAB — URINE CYTOLOGY ANCILLARY ONLY
Chlamydia: NEGATIVE
Comment: NEGATIVE
Comment: NORMAL
Neisseria Gonorrhea: NEGATIVE

## 2023-02-23 MED ORDER — INFLUENZA VIRUS VACC SPLIT PF (FLUZONE) 0.5 ML IM SUSY
0.5000 mL | PREFILLED_SYRINGE | Freq: Once | INTRAMUSCULAR | 0 refills | Status: AC
  Filled 2023-02-23: qty 0.5, 1d supply, fill #0

## 2023-02-24 LAB — HIV-1 RNA QUANT-NO REFLEX-BLD
HIV 1 RNA Quant: NOT DETECTED {copies}/mL
HIV-1 RNA Quant, Log: NOT DETECTED {Log}

## 2023-02-24 LAB — RPR TITER: RPR Titer: 1:4 {titer} — ABNORMAL HIGH

## 2023-02-24 LAB — T PALLIDUM AB: T Pallidum Abs: POSITIVE — AB

## 2023-02-24 LAB — RPR: RPR Ser Ql: REACTIVE — AB

## 2023-02-27 ENCOUNTER — Other Ambulatory Visit: Payer: Self-pay

## 2023-03-08 ENCOUNTER — Other Ambulatory Visit: Payer: Self-pay

## 2023-03-08 NOTE — Progress Notes (Signed)
Specialty Pharmacy Refill Coordination Note  RAEL PHI is a 22 y.o. male contacted today regarding refills of specialty medication(s) Bictegravir-Emtricitab-Tenofov    Medication will be filled on 03/16/23.

## 2023-04-06 ENCOUNTER — Other Ambulatory Visit (HOSPITAL_COMMUNITY): Payer: Self-pay

## 2023-04-06 NOTE — Progress Notes (Signed)
Specialty Pharmacy Refill Coordination Note  Randy Armstrong is a 22 y.o. male contacted today regarding refills of specialty medication(s) Bictegravir-Emtricitab-Tenofov   Patient requested [name] at [facility] at [place] date: 04/11/23   Medication will be filled on 04/10/23.

## 2023-04-24 ENCOUNTER — Other Ambulatory Visit: Payer: Self-pay

## 2023-05-12 ENCOUNTER — Other Ambulatory Visit: Payer: Self-pay

## 2023-05-12 NOTE — Progress Notes (Signed)
Specialty Pharmacy Ongoing Clinical Assessment Note  Randy Armstrong is a 22 y.o. male who is being followed by the specialty pharmacy service for RxSp HIV   Patient's specialty medication(s) reviewed today: Bictegravir-Emtricitab-Tenofov (BIKTARVY)   Missed doses in the last 4 weeks: 0   Patient/Caregiver did not have any additional questions or concerns.   Therapeutic benefit summary: Patient is achieving benefit (Last OV, 02/21/23 HIV RNA Not Detected.)   Adverse events/side effects summary: No adverse events/side effects   Patient's therapy is appropriate to: Continue    Goals Addressed             This Visit's Progress    Achieve Undetectable HIV Viral Load < 20       Patient is on track. Patient will maintain adherence         Follow up:  6 months  Bobette Mo Specialty Pharmacist

## 2023-05-12 NOTE — Progress Notes (Signed)
Specialty Pharmacy Refill Coordination Note  Randy Armstrong is a 22 y.o. male contacted today regarding refills of specialty medication(s) Bictegravir-Emtricitab-Tenofov [name])   Patient requested [name] at [facility] at [place] date: 05/16/23   Medication will be filled on 05/15/23.

## 2023-05-15 ENCOUNTER — Other Ambulatory Visit: Payer: Self-pay

## 2023-05-31 ENCOUNTER — Other Ambulatory Visit: Payer: Self-pay

## 2023-06-14 ENCOUNTER — Other Ambulatory Visit: Payer: Self-pay

## 2023-06-14 NOTE — Progress Notes (Signed)
Specialty Pharmacy Refill Coordination Note  Randy Armstrong is a 23 y.o. male contacted today regarding refills of specialty medication(s) Bictegravir-Emtricitab-Tenofov [name])   Patient requested [name] at [facility] at [place] date: 06/19/23   Medication will be filled on 06/19/23.

## 2023-06-19 ENCOUNTER — Other Ambulatory Visit: Payer: Self-pay

## 2023-06-28 ENCOUNTER — Encounter: Payer: Self-pay | Admitting: Internal Medicine

## 2023-06-28 ENCOUNTER — Other Ambulatory Visit: Payer: Self-pay

## 2023-06-28 ENCOUNTER — Other Ambulatory Visit (HOSPITAL_COMMUNITY)
Admission: RE | Admit: 2023-06-28 | Discharge: 2023-06-28 | Disposition: A | Discharge status: Home / Self Care | Payer: Commercial Managed Care - PPO | Source: Ambulatory Visit | Attending: Internal Medicine | Admitting: Internal Medicine

## 2023-06-28 ENCOUNTER — Ambulatory Visit: Payer: Commercial Managed Care - PPO | Admitting: Internal Medicine

## 2023-06-28 VITALS — BP 125/72 | HR 63 | Temp 98.0°F | Wt 195.6 lb

## 2023-06-28 DIAGNOSIS — B20 Human immunodeficiency virus [HIV] disease: Secondary | ICD-10-CM | POA: Diagnosis not present

## 2023-06-28 DIAGNOSIS — Z23 Encounter for immunization: Secondary | ICD-10-CM

## 2023-06-28 DIAGNOSIS — Z113 Encounter for screening for infections with a predominantly sexual mode of transmission: Secondary | ICD-10-CM | POA: Diagnosis not present

## 2023-06-28 NOTE — Progress Notes (Signed)
   Subjective:    Patient ID: Randy Armstrong, male    DOB: May 06, 2001, 23 y.o.   MRN: [id number]  HPI [name] is here for follow-up of HIV. He continues on [place] with no missed doses.  He feels well and has no complaints today.  No issues with getting or taking his medication.  Since his initial diagnosis more than a year ago, he has remained not detected.   Review of Systems  Constitutional:  Negative for fatigue.  Skin:  Negative for rash.       Objective:   Physical Exam Eyes:     General: No scleral icterus. Pulmonary:     Effort: Pulmonary effort is normal.  Neurological:     Mental Status: He is alert.   SH: no tobacco        Assessment & Plan:

## 2023-06-28 NOTE — Assessment & Plan Note (Signed)
He continues on [place] and no issues with this and will continue.  Will check his viral load today to assure suppression.  Otherwise he can continue with follow-up in 4 months.

## 2023-06-28 NOTE — Assessment & Plan Note (Signed)
[place] the Prevnar vaccine and this was given today.

## 2023-06-28 NOTE — Assessment & Plan Note (Signed)
Will do routine screening today

## 2023-06-29 LAB — CYTOLOGY, (ORAL, ANAL, URETHRAL) ANCILLARY ONLY
Chlamydia: NEGATIVE
Chlamydia: NEGATIVE
Comment: NEGATIVE
Comment: NEGATIVE
Comment: NORMAL
Comment: NORMAL
Neisseria Gonorrhea: NEGATIVE
Neisseria Gonorrhea: NEGATIVE

## 2023-06-29 LAB — URINE CYTOLOGY ANCILLARY ONLY
Chlamydia: NEGATIVE
Comment: NEGATIVE
Comment: NORMAL
Neisseria Gonorrhea: NEGATIVE

## 2023-07-01 LAB — RPR TITER: RPR Titer: 1:2 {titer} — ABNORMAL HIGH

## 2023-07-01 LAB — HIV-1 RNA QUANT-NO REFLEX-BLD
HIV 1 RNA Quant: NOT DETECTED {copies}/mL
HIV-1 RNA Quant, Log: NOT DETECTED {Log}

## 2023-07-01 LAB — T PALLIDUM AB: T Pallidum Abs: POSITIVE — AB

## 2023-07-01 LAB — RPR: RPR Ser Ql: REACTIVE — AB

## 2023-07-10 ENCOUNTER — Other Ambulatory Visit: Payer: Self-pay | Admitting: Pharmacy Technician

## 2023-07-10 ENCOUNTER — Other Ambulatory Visit: Payer: Self-pay

## 2023-07-10 NOTE — Progress Notes (Signed)
 Specialty Pharmacy Refill Coordination Note  CURBY CARSWELL is a 23 y.o. male contacted today regarding refills of specialty medication(s) Bictegravir-Emtricitab-Tenofov [name])   Patient requested Courier to Provider Office   Delivery date: 07/14/23   Verified address: [facility] [address] [place], [place] [address]   Medication will be filled on 07/14/23.

## 2023-07-14 ENCOUNTER — Other Ambulatory Visit: Payer: Self-pay

## 2023-08-09 ENCOUNTER — Other Ambulatory Visit: Payer: Self-pay

## 2023-08-09 NOTE — Progress Notes (Signed)
 Specialty Pharmacy Refill Coordination Note  Randy Armstrong is a 23 y.o. male contacted today regarding refills of specialty medication(s) Bictegravir-Emtricitab-Tenofov [facility])   Patient requested (Patient-Rptd) Delivery   Delivery date: (Patient-Rptd) 08/16/23   Verified address: (Patient-Rptd) [address] [place], [place] [address]   Medication will be filled on 08/16/23.

## 2023-09-11 ENCOUNTER — Other Ambulatory Visit: Payer: Self-pay

## 2023-09-14 ENCOUNTER — Other Ambulatory Visit: Payer: Self-pay

## 2023-09-14 ENCOUNTER — Other Ambulatory Visit: Payer: Self-pay | Admitting: Pharmacy Technician

## 2023-09-14 NOTE — Progress Notes (Signed)
 Specialty Pharmacy Refill Coordination Note  Randy Armstrong is a 23 y.o. male contacted today regarding refills of specialty medication(s) Bictegravir-Emtricitab-Tenofov (BIKTARVY )   Patient requested (Patient-Rptd) Delivery   Pickup date: 09/14/23  Medication will be filled on 09/14/23.   Courier Pick Up at [facility] [address] [place], [place]

## 2023-09-15 ENCOUNTER — Other Ambulatory Visit: Payer: Self-pay

## 2023-10-10 ENCOUNTER — Other Ambulatory Visit: Payer: Self-pay | Admitting: Internal Medicine

## 2023-10-10 ENCOUNTER — Other Ambulatory Visit: Payer: Self-pay

## 2023-10-10 DIAGNOSIS — B20 Human immunodeficiency virus [HIV] disease: Secondary | ICD-10-CM

## 2023-10-10 MED ORDER — BIKTARVY 50-200-25 MG PO TABS
1.0000 | ORAL_TABLET | Freq: Every day | ORAL | 1 refills | Status: DC
  Filled 2023-10-10: qty 30, 30d supply, fill #0
  Filled 2023-11-01: qty 30, 30d supply, fill #1

## 2023-10-10 NOTE — Progress Notes (Signed)
 Specialty Pharmacy Refill Coordination Note  Randy Armstrong is a 23 y.o. male contacted today regarding refills of specialty medication(s) Bictegravir-Emtricitab-Tenofov (BIKTARVY )   Patient requested Delivery   Delivery date: 10/12/23   Verified address: [facility] [address]   Medication will be filled on 05.28.25.   This fill date is pending response to refill request from provider. Patient is aware and if they have not received fill by intended date they must follow up with pharmacy.

## 2023-10-11 ENCOUNTER — Other Ambulatory Visit: Payer: Self-pay

## 2023-10-12 ENCOUNTER — Other Ambulatory Visit: Payer: Self-pay

## 2023-10-18 ENCOUNTER — Other Ambulatory Visit: Payer: Self-pay

## 2023-11-01 ENCOUNTER — Other Ambulatory Visit: Payer: Self-pay

## 2023-11-01 NOTE — Progress Notes (Signed)
 Specialty Pharmacy Ongoing Clinical Assessment Note  Randy Armstrong is a 23 y.o. male who is being followed by the specialty pharmacy service for RxSp HIV   Patient's specialty medication(s) reviewed today: Bictegravir-Emtricitab-Tenofov (Biktarvy )   Missed doses in the last 4 weeks: 0   Patient/Caregiver did not have any additional questions or concerns.   Therapeutic benefit summary: Patient is achieving benefit   Adverse events/side effects summary: No adverse events/side effects   Patient's therapy is appropriate to: Continue    Goals Addressed             This Visit's Progress    Achieve Undetectable HIV Viral Load < 20   On track    Patient is on track. Patient will maintain adherence         Follow up: 12 months  Telesforo Brosnahan M Delphia Kaylor Specialty Pharmacist

## 2023-11-01 NOTE — Progress Notes (Signed)
 Specialty Pharmacy Refill Coordination Note  Randy Armstrong is a 23 y.o. male contacted today regarding refills of specialty medication(s) Bictegravir-Emtricitab-Tenofov (Biktarvy )   Patient requested Delivery   Delivery date: 11/16/23   Verified address: [facility] - [address]   Medication will be filled on 11/15/23.

## 2023-11-15 ENCOUNTER — Other Ambulatory Visit: Payer: Self-pay

## 2023-11-16 ENCOUNTER — Other Ambulatory Visit (HOSPITAL_COMMUNITY): Payer: Self-pay

## 2023-11-16 ENCOUNTER — Other Ambulatory Visit: Payer: Self-pay

## 2023-11-16 ENCOUNTER — Encounter: Payer: Self-pay | Admitting: Infectious Diseases

## 2023-11-16 ENCOUNTER — Ambulatory Visit: Payer: Commercial Managed Care - PPO | Admitting: Internal Medicine

## 2023-11-16 ENCOUNTER — Ambulatory Visit: Admitting: Infectious Diseases

## 2023-11-16 ENCOUNTER — Other Ambulatory Visit (HOSPITAL_COMMUNITY)
Admission: RE | Admit: 2023-11-16 | Discharge: 2023-11-16 | Disposition: A | Discharge status: Home / Self Care | Source: Ambulatory Visit | Attending: Infectious Diseases | Admitting: Infectious Diseases

## 2023-11-16 VITALS — BP 116/66 | HR 73 | Ht 74.0 in | Wt 211.0 lb

## 2023-11-16 DIAGNOSIS — Z0000 Encounter for general adult medical examination without abnormal findings: Secondary | ICD-10-CM | POA: Diagnosis not present

## 2023-11-16 DIAGNOSIS — Z79899 Other long term (current) drug therapy: Secondary | ICD-10-CM | POA: Diagnosis not present

## 2023-11-16 DIAGNOSIS — Z113 Encounter for screening for infections with a predominantly sexual mode of transmission: Secondary | ICD-10-CM | POA: Insufficient documentation

## 2023-11-16 DIAGNOSIS — Z23 Encounter for immunization: Secondary | ICD-10-CM

## 2023-11-16 DIAGNOSIS — B20 Human immunodeficiency virus [HIV] disease: Secondary | ICD-10-CM

## 2023-11-16 MED ORDER — BIKTARVY 50-200-25 MG PO TABS
1.0000 | ORAL_TABLET | Freq: Every day | ORAL | 3 refills | Status: AC
  Filled 2023-11-16: qty 90, 90d supply, fill #0
  Filled 2023-12-08: qty 30, 30d supply, fill #0
  Filled 2024-01-05: qty 30, 30d supply, fill #1
  Filled 2024-02-08: qty 30, 30d supply, fill #2
  Filled 2024-03-07 (×2): qty 30, 30d supply, fill #3
  Filled 2024-04-03 (×2): qty 30, 30d supply, fill #4
  Filled 2024-05-08: qty 30, 30d supply, fill #5
  Filled 2024-06-05: qty 30, 30d supply, fill #6

## 2023-11-16 NOTE — Progress Notes (Signed)
 [address], [place], [place], [address]                                                                  Phn. [phone]; Fax: [phone]                                                                             Date: 11/16/23  Reason for Visit: Routine HIV care.   HPI: Randy Armstrong is a 23 y.o.old male with a history of HIV, asthma, allergic rhinitis, syphilis s/p treatment who is here for regular fu. Patient previously followed by Dr Efrain and was last seen on 06/28/23.   Interval hx/current visit: Reports being compliant with Biktarvy  without any missed doses or concerns. Reports being sexually active with male partners, engaging in both oral and anal intercourse. Willing to do STD screening.   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 Follows outside dental clinic. Willing to get Hep A vaccine. No other concerns,   ROS: As stated in above HPI; all other systems were reviewed and are otherwise negative unless noted below  No reported fever / chills, night sweats, unintentional weight loss, acute visual change, odynophagia, chest pain/pressure, new or worsened SOB or WOB, nausea, vomiting, diarrhea, dysuria, GU discharge, syncope, seizures, red/hot swollen joints, hallucinations / delusions, rashes, new allergies, unusual / excessive bleeding, swollen lymph nodes, or new hospitalizations/ED visits/Urgent Care visits since the pt was last seen.  PMH/ PSH/ FamHx / Social Hx , medications and allergies reviewed and updated as appropriate; please see corresponding tab in EHR / prior notes                                        No current outpatient medications on file prior to visit.   No current facility-administered  medications on file prior to visit.    Allergies  Allergen Reactions   Diphenhydramine Hcl     REACTION: itching   Past Medical History:  Diagnosis Date   Asthma    Seasonal allergic rhinitis due to pollen 04/22/2019   No past surgical history on file.  Social History   Socioeconomic History   Marital status: Single    Spouse name: Not on file   Number of children: Not on file   Years of education: Not on file   Highest education level: Not on file  Occupational History   Not on file  Tobacco Use   Smoking status: Never  Smokeless tobacco: Never  Substance and Sexual Activity   Alcohol use: No   Drug use: Not Currently   Sexual activity: Not Currently    Comment: declined condoms  Other Topics Concern   Not on file  Social History Narrative   Not on file   Social Drivers of Health   Financial Resource Strain: Not on file  Food Insecurity: Not on file  Transportation Needs: Not on file  Physical Activity: Not on file  Stress: Not on file  Social Connections: Not on file  Intimate Partner Violence: Not on file   Family History  Problem Relation Age of Onset   Cancer Mother    Arthritis Maternal Grandmother    Hypertension Maternal Grandmother    Arthritis Paternal Grandmother    Hypertension Paternal Grandmother    Vitals  BP 116/66   Pulse 73   Ht 6' 2 (1.88 m)   Wt 211 lb (95.7 kg)   SpO2 95%   BMI 27.09 kg/m    Examination  Gen: no acute distress HEENT: [place]/AT, no scleral icterus, no pale conjunctivae, hearing normal, oral mucosa moist Neck: Supple Cardio: Regular rate and rhythm, s1s2 Resp: Pulmonary effort normal in room air, Normal breath sounds  GI: nondistended GU: Musc: Extremities: No pedal edema Skin: No rashes Neuro: grossly non focal , awake, alert and oriented * 3  Psych: Calm, cooperative  Lab Results HIV 1 RNA Quant (Copies/mL)  Date Value  06/28/2023 Not Detected  02/21/2023 Not Detected  10/18/2022 Not Detected    CD4 T Cell Abs (/uL)  Date Value  02/21/2023 657  07/27/2022 599  06/29/2022 474   No results found for: HIV1GENOSEQ Lab Results  Component Value Date   WBC 6.0 06/29/2022   HGB 14.4 06/29/2022   HCT 43.1 06/29/2022   MCV 94.1 06/29/2022   PLT 356 06/29/2022    Lab Results  Component Value Date   CREATININE 0.81 07/27/2022   BUN 9 07/27/2022   NA 140 07/27/2022   K 4.3 07/27/2022   CL 108 07/27/2022   CO2 27 07/27/2022   Lab Results  Component Value Date   ALT 16 07/27/2022   AST 22 07/27/2022   ALKPHOS 79 08/07/2021   BILITOT 0.5 07/27/2022    Lab Results  Component Value Date   CHOL 142 06/29/2022   TRIG 85 06/29/2022   HDL 42 06/29/2022   LDLCALC 82 06/29/2022   Lab Results  Component Value Date   HAV NON-REACTIVE 06/29/2022   Lab Results  Component Value Date   HEPBSAG NON-REACTIVE 06/29/2022   HEPBSAB NON-REACTIVE 06/29/2022   No results found for: HCVAB Lab Results  Component Value Date   CHLAMYDIAWP Negative 06/28/2023   CHLAMYDIAWP Negative 06/28/2023   CHLAMYDIAWP Negative 06/28/2023   N Negative 06/28/2023   N Negative 06/28/2023   N Negative 06/28/2023   No results found for: GCPROBEAPT No results found for: QUANTGOLD  Health Maintenance: Immunization History  Administered Date(s) Administered   DTaP 11/04/1998, 01/04/1999, 03/09/1999, 12/02/1999, 11/13/2003   HIB (PRP-OMP) 11/04/1998, 01/04/1999, 03/09/1999, 12/02/1999   HPV 9-valent 08/25/2016, 03/27/2018, 07/27/2022   Hepatitis A, Ped/Adol-2 Dose 05/18/2016   Hepatitis B, PED/ADOLESCENT 11/04/1998, 01/04/1999, 06/08/1999   Hepb-cpg 07/27/2022, 10/18/2022   IPV 11/04/1998, 01/04/1999, 12/02/1999, 11/13/2003   Influenza, Seasonal, Injecte, Preservative Fre 05/18/2016, 02/23/2023   Influenza,inj,Quad PF,6+ Mos 04/22/2019, 02/27/2022   Influenza-Unspecified 03/27/2018, 03/27/2018   MMR 09/07/1999, 11/13/2003   Meningococcal Acwy, Unspecified 12/23/2009   Meningococcal  Conjugate 12/23/2009  Moderna Sars-Covid-2 Vaccination 07/27/2019, 08/28/2019   PNEUMOCOCCAL CONJUGATE-20 06/28/2023   Pneumococcal-Unspecified 09/07/1999, 12/02/1999   Td 12/23/2009   Tdap 09/07/1999, 12/23/2009, 07/27/2022   Varicella 09/07/1999, 12/23/2009    Assessment/Plan: # HIV - continue Biktarvy  1 tab po daily  - labs today  - fu in 4 months   # STD Screening  - no acute concerns  - Urine, oral and anal GC + RPR  # Immunization  - Hepatitis A #1 today   # Health maintenance - Lipid panel today  - check hep B surface ab to see if immune - Follows outside dental clinic  Patient's labs were reviewed as well as his previous records. Patients questions were addressed and answered. Safe sex counseling done.  I spent 30 minutes involved in face-to-face and non-face-to-face activities for this patient on the day of the visit. Professional time spent includes the following activities: Preparing to see the patient (review of tests), Obtaining and reviewing separately obtained history (prior notes from Dr Efrain), Performing a medically appropriate examination and evaluation , Ordering medications/labs and vaccine, Documenting clinical information in the EMR, Independently interpreting results (not separately reported), Communicating results to the patient, Counseling and educating the patient and Care coordination (not separately reported).   Of note, portions of this note may have been created with voice recognition software. While this note has been edited for accuracy, occasional wrong-word or 'sound-a-like' substitutions may have occurred due to the inherent limitations of voice recognition software.   Electronically signed by:  Annalee Orem, MD Infectious Disease Physician [facility] [address] [place], [place] [address] Phone: [phone]  Fax: [phone]

## 2023-11-17 DIAGNOSIS — Z23 Encounter for immunization: Secondary | ICD-10-CM | POA: Insufficient documentation

## 2023-11-20 ENCOUNTER — Ambulatory Visit: Payer: Self-pay | Admitting: Internal Medicine

## 2023-11-20 LAB — LIPID PANEL
Cholesterol: 199 mg/dL (ref <200)
HDL: 49 mg/dL (ref >=40)
LDL Cholesterol (Calc): 120 mg/dL — ABNORMAL HIGH
Non-HDL Cholesterol (Calc): 150 mg/dL — ABNORMAL HIGH (ref <130)
Total CHOL/HDL Ratio: 4.1 (calc) (ref <5.0)
Triglycerides: 182 mg/dL — ABNORMAL HIGH (ref <150)

## 2023-11-20 LAB — COMPREHENSIVE METABOLIC PANEL WITH GFR
AG Ratio: 1.4 (calc) (ref 1.0–2.5)
ALT: 112 U/L — ABNORMAL HIGH (ref 9–46)
AST: 72 U/L — ABNORMAL HIGH (ref 10–40)
Albumin: 4.7 g/dL (ref 3.6–5.1)
Alkaline phosphatase (APISO): 99 U/L (ref 36–130)
BUN: 9 mg/dL (ref 7–25)
CO2: 24 mmol/L (ref 20–32)
Calcium: 9.7 mg/dL (ref 8.6–10.3)
Chloride: 105 mmol/L (ref 98–110)
Creat: 0.85 mg/dL (ref 0.60–1.24)
Globulin: 3.4 g/dL (ref 1.9–3.7)
Glucose, Bld: 114 mg/dL — ABNORMAL HIGH (ref 65–99)
Potassium: 4.3 mmol/L (ref 3.5–5.3)
Sodium: 138 mmol/L (ref 135–146)
Total Bilirubin: 0.6 mg/dL (ref 0.2–1.2)
Total Protein: 8.1 g/dL (ref 6.1–8.1)
eGFR: 124 mL/min/1.73m2 (ref >=60)

## 2023-11-20 LAB — T-HELPER CELLS (CD4) COUNT (NOT AT ARMC)
Absolute CD4: 788 {cells}/uL (ref 490–1740)
CD4 T Helper %: 21 % — ABNORMAL LOW (ref 30–61)
Total lymphocyte count: 3805 {cells}/uL (ref 850–3900)

## 2023-11-20 LAB — CYTOLOGY, (ORAL, ANAL, URETHRAL) ANCILLARY ONLY
Chlamydia: NEGATIVE
Chlamydia: NEGATIVE
Comment: NEGATIVE
Comment: NEGATIVE
Comment: NORMAL
Comment: NORMAL
Neisseria Gonorrhea: NEGATIVE
Neisseria Gonorrhea: NEGATIVE

## 2023-11-20 LAB — HEPATITIS B SURFACE ANTIBODY,QUALITATIVE: Hep B S Ab: NONREACTIVE

## 2023-11-20 LAB — URINE CYTOLOGY ANCILLARY ONLY
Chlamydia: NEGATIVE
Comment: NEGATIVE
Comment: NORMAL
Neisseria Gonorrhea: NEGATIVE

## 2023-11-20 LAB — HIV RNA, RTPCR W/R GT (RTI, PI,INT)
HIV 1 RNA Quant: NOT DETECTED {copies}/mL
HIV-1 RNA Quant, Log: NOT DETECTED {Log_copies}/mL

## 2023-11-21 ENCOUNTER — Other Ambulatory Visit: Payer: Self-pay

## 2023-11-23 ENCOUNTER — Other Ambulatory Visit: Payer: Self-pay

## 2023-12-08 ENCOUNTER — Other Ambulatory Visit: Payer: Self-pay

## 2023-12-08 NOTE — Progress Notes (Signed)
 Specialty Pharmacy Refill Coordination Note  Randy Armstrong is a 23 y.o. male contacted today regarding refills of specialty medication(s) Bictegravir-Emtricitab-Tenofov (Biktarvy )   Patient requested Delivery   Delivery date: 12/14/23   Verified address: [facility] [address]   Medication will be filled on 12/13/23.

## 2023-12-12 ENCOUNTER — Other Ambulatory Visit: Payer: Self-pay

## 2023-12-14 ENCOUNTER — Other Ambulatory Visit: Payer: Self-pay

## 2023-12-26 ENCOUNTER — Other Ambulatory Visit: Payer: Self-pay

## 2024-01-05 ENCOUNTER — Other Ambulatory Visit: Payer: Self-pay | Admitting: Pharmacy Technician

## 2024-01-05 ENCOUNTER — Other Ambulatory Visit: Payer: Self-pay

## 2024-01-05 NOTE — Progress Notes (Signed)
 Specialty Pharmacy Refill Coordination Note  Randy Armstrong is a 23 y.o. male contacted today regarding refills of specialty medication(s) Bictegravir-Emtricitab-Tenofov (Biktarvy )   Patient requested [name] at [facility] at [place] (Pt wil pick up at [facility])   Pickup date: 01/15/24   Medication will be filled on 01/15/24.

## 2024-01-08 ENCOUNTER — Other Ambulatory Visit: Payer: Self-pay

## 2024-01-08 NOTE — Progress Notes (Signed)
 Patient was notified that medication will be made ready for pick up on 01/16/24 due to the upcoming Labor day holiday

## 2024-01-16 ENCOUNTER — Other Ambulatory Visit: Payer: Self-pay

## 2024-01-17 ENCOUNTER — Other Ambulatory Visit: Payer: Self-pay

## 2024-02-08 ENCOUNTER — Other Ambulatory Visit: Payer: Self-pay

## 2024-02-08 ENCOUNTER — Other Ambulatory Visit: Payer: Self-pay | Admitting: Pharmacy Technician

## 2024-02-08 NOTE — Progress Notes (Signed)
 Specialty Pharmacy Refill Coordination Note  THURL BOEN is a 24 y.o. male contacted today regarding refills of specialty medication(s) Bictegravir-Emtricitab-Tenofov (Biktarvy )   Patient requested Delivery   Delivery date: 02/13/24   Verified address: [facility] - [address]  Medication will be filled on 02/13/24.

## 2024-02-13 ENCOUNTER — Other Ambulatory Visit: Payer: Self-pay

## 2024-02-21 ENCOUNTER — Other Ambulatory Visit (HOSPITAL_BASED_OUTPATIENT_CLINIC_OR_DEPARTMENT_OTHER): Payer: Self-pay

## 2024-02-21 MED ORDER — FLUZONE 0.5 ML IM SUSY
0.5000 mL | PREFILLED_SYRINGE | Freq: Once | INTRAMUSCULAR | 0 refills | Status: AC
  Filled 2024-02-21: qty 0.5, 1d supply, fill #0

## 2024-03-07 ENCOUNTER — Other Ambulatory Visit (HOSPITAL_COMMUNITY): Payer: Self-pay

## 2024-03-07 ENCOUNTER — Other Ambulatory Visit: Payer: Self-pay

## 2024-03-07 NOTE — Progress Notes (Signed)
 Specialty Pharmacy Refill Coordination Note  Randy Armstrong is a 23 y.o. male contacted today regarding refills of specialty medication(s) Bictegravir-Emtricitab-Tenofov (Biktarvy )   Patient requested Delivery   Delivery date: 03/14/24   Verified [facility]: [facility]   Medication will be filled on 03/13/24.

## 2024-03-12 ENCOUNTER — Other Ambulatory Visit: Payer: Self-pay

## 2024-03-13 ENCOUNTER — Other Ambulatory Visit: Payer: Self-pay

## 2024-03-19 ENCOUNTER — Ambulatory Visit: Admitting: Infectious Diseases

## 2024-04-03 ENCOUNTER — Other Ambulatory Visit: Payer: Self-pay

## 2024-04-03 NOTE — Progress Notes (Signed)
 Specialty Pharmacy Refill Coordination Note  Randy Armstrong is a 23 y.o. male assessed today regarding refills of clinic administered specialty medication(s) Bictegravir-Emtricitab-Tenofov (Biktarvy )   Clinic requested Delivery   Delivery date: 04/15/24   Verified address: [facility]   Medication will be filled on: 04/15/24

## 2024-04-10 ENCOUNTER — Encounter: Payer: Self-pay | Admitting: Infectious Diseases

## 2024-04-10 ENCOUNTER — Ambulatory Visit: Admitting: Infectious Diseases

## 2024-04-10 ENCOUNTER — Other Ambulatory Visit: Payer: Self-pay

## 2024-04-10 VITALS — BP 130/75 | HR 82 | Temp 97.9°F | Ht 74.0 in | Wt 215.0 lb

## 2024-04-10 DIAGNOSIS — Z79899 Other long term (current) drug therapy: Secondary | ICD-10-CM

## 2024-04-10 DIAGNOSIS — Z23 Encounter for immunization: Secondary | ICD-10-CM | POA: Diagnosis not present

## 2024-04-10 DIAGNOSIS — Z7185 Encounter for immunization safety counseling: Secondary | ICD-10-CM | POA: Diagnosis not present

## 2024-04-10 DIAGNOSIS — Z5181 Encounter for therapeutic drug level monitoring: Secondary | ICD-10-CM | POA: Insufficient documentation

## 2024-04-10 DIAGNOSIS — B20 Human immunodeficiency virus [HIV] disease: Secondary | ICD-10-CM | POA: Diagnosis not present

## 2024-04-10 DIAGNOSIS — Z0000 Encounter for general adult medical examination without abnormal findings: Secondary | ICD-10-CM

## 2024-04-10 DIAGNOSIS — Z113 Encounter for screening for infections with a predominantly sexual mode of transmission: Secondary | ICD-10-CM | POA: Diagnosis not present

## 2024-04-10 NOTE — Progress Notes (Signed)
 [address], [place], [place], [address]                                                                  Phn. [phone]; Fax: [phone]                                                                             Date: 04/10/24  Reason for Visit: Routine HIV care.   HPI: Randy Armstrong is a 23 y.o.old male with a history of HIV, asthma, allergic rhinitis, syphilis s/p treatment who is here for regular fu. Patient previously followed by Dr Efrain.  11/26 Reports being compliant with Biktarvy  without any missed doses or concerns. Reports being sexually active with male partners, engaging in both oral and anal intercourse. Willing to do STD screening.   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 Follows outside dental clinic. Willing to get Hep A vaccine. No other concerns,   11/26 Reports being compliant to Biktarvy  with no missed doses or concerns, sexually active with single male partner.  Willing to be screened for STDs. Willing to get HBV #1. Denies smoking, alcohol or recreational drug use. He is going to parents house celebrating Thanksgiving. No concerns otherwise.   ROS: As stated in above HPI; all other systems were reviewed and are otherwise negative unless noted below  No reported fever / chills, night sweats, unintentional weight loss, acute visual change, odynophagia, chest pain/pressure, new or worsened SOB or WOB, nausea, vomiting, diarrhea, dysuria, GU discharge, syncope, seizures, red/hot swollen joints, hallucinations / delusions, rashes, new allergies, unusual / excessive bleeding, swollen lymph nodes, or new hospitalizations/ED visits/Urgent Care visits since the pt was last seen.  PMH/ PSH/ FamHx /  Social Hx , medications and allergies reviewed and updated as appropriate; please see corresponding tab in EHR / prior notes                                        Current Outpatient Medications on File Prior to Visit  Medication Sig Dispense Refill   bictegravir-emtricitabine -tenofovir  AF (BIKTARVY ) 50-200-25 MG TABS tablet Take 1 tablet by mouth daily. 90 tablet 3   No current facility-administered medications on file prior to visit.    Allergies  Allergen Reactions   Diphenhydramine Hcl     REACTION: itching   Past Medical History:  Diagnosis Date   Asthma    Seasonal allergic rhinitis due to pollen 04/22/2019   No past surgical history  on file.  Social History   Socioeconomic History   Marital status: Single    Spouse name: Not on file   Number of children: Not on file   Years of education: Not on file   Highest education level: Not on file  Occupational History   Not on file  Tobacco Use   Smoking status: Never   Smokeless tobacco: Never  Substance and Sexual Activity   Alcohol use: No   Drug use: Not Currently   Sexual activity: Not Currently    Comment: declined condoms  Other Topics Concern   Not on file  Social History Narrative   Not on file   Social Drivers of Health   Financial Resource Strain: Not on file  Food Insecurity: Not on file  Transportation Needs: Not on file  Physical Activity: Not on file  Stress: Not on file  Social Connections: Not on file  Intimate Partner Violence: Not on file   Family History  Problem Relation Age of Onset   Cancer Mother    Arthritis Maternal Grandmother    Hypertension Maternal Grandmother    Arthritis Paternal Grandmother    Hypertension Paternal Grandmother    Vitals  BP 130/75   Pulse 82   Temp 97.9 F (36.6 C) (Temporal)   Ht 6' 2 (1.88 m)   Wt 215 lb (97.5 kg)   SpO2 97%   BMI 27.60 kg/m    Examination  Gen: no acute distress HEENT: [place]/AT, no scleral icterus, no pale conjunctivae, hearing  normal, oral mucosa moist Neck: Supple Cardio: Normal rate Resp: Pulmonary effort normal in room air GI: nondistended GU: Musc: Extremities: No pedal edema Skin: No rashes Neuro: grossly non focal , awake, alert and oriented * 3  Psych: Calm, cooperative  Lab Results HIV 1 RNA Quant  Date Value  11/16/2023 NOT DETECTED copies/mL  06/28/2023 Not Detected Copies/mL  02/21/2023 Not Detected Copies/mL   CD4 T Cell Abs (/uL)  Date Value  02/21/2023 657  07/27/2022 599  06/29/2022 474   No results found for: HIV1GENOSEQ Lab Results  Component Value Date   WBC 6.0 06/29/2022   HGB 14.4 06/29/2022   HCT 43.1 06/29/2022   MCV 94.1 06/29/2022   PLT 356 06/29/2022    Lab Results  Component Value Date   CREATININE 0.85 11/16/2023   BUN 9 11/16/2023   NA 138 11/16/2023   K 4.3 11/16/2023   CL 105 11/16/2023   CO2 24 11/16/2023   Lab Results  Component Value Date   ALT 112 (H) 11/16/2023   AST 72 (H) 11/16/2023   ALKPHOS 79 08/07/2021   BILITOT 0.6 11/16/2023    Lab Results  Component Value Date   CHOL 199 11/16/2023   TRIG 182 (H) 11/16/2023   HDL 49 11/16/2023   LDLCALC 120 (H) 11/16/2023   Lab Results  Component Value Date   HAV NON-REACTIVE 06/29/2022   Lab Results  Component Value Date   HEPBSAG NON-REACTIVE 06/29/2022   HEPBSAB NON-REACTIVE 11/16/2023   No results found for: HCVAB Lab Results  Component Value Date   CHLAMYDIAWP Negative 11/16/2023   CHLAMYDIAWP Negative 11/16/2023   CHLAMYDIAWP Negative 11/16/2023   N Negative 11/16/2023   N Negative 11/16/2023   N Negative 11/16/2023   No results found for: GCPROBEAPT No results found for: QUANTGOLD  Health Maintenance: Immunization History  Administered Date(s) Administered   DTaP 11/04/1998, 01/04/1999, 03/09/1999, 12/02/1999, 11/13/2003   HIB (PRP-OMP) 11/04/1998, 01/04/1999, 03/09/1999, 12/02/1999   HPV  9-valent 08/25/2016, 03/27/2018, 07/27/2022   Hepatitis A, Adult 11/16/2023    Hepatitis A, Ped/Adol-2 Dose 05/18/2016   Hepatitis B, PED/ADOLESCENT 11/04/1998, 01/04/1999, 06/08/1999   Hepb-cpg 07/27/2022, 10/18/2022   IPV 11/04/1998, 01/04/1999, 12/02/1999, 11/13/2003   Influenza, Seasonal, Injecte, Preservative Fre 05/18/2016, 02/23/2023, 02/21/2024   Influenza,inj,Quad PF,6+ Mos 04/22/2019, 02/27/2022   Influenza-Unspecified 03/27/2018, 03/27/2018   MMR 09/07/1999, 11/13/2003   Meningococcal Acwy, Unspecified 12/23/2009   Meningococcal Conjugate 12/23/2009   Moderna Sars-Covid-2 Vaccination 07/27/2019, 08/28/2019   PNEUMOCOCCAL CONJUGATE-20 06/28/2023   Pneumococcal-Unspecified 09/07/1999, 12/02/1999   Td 12/23/2009   Tdap 09/07/1999, 12/23/2009, 07/27/2022   Varicella 09/07/1999, 12/23/2009    Assessment/Plan: # HIV - Adherence assessed, side effects reviewed/discussed and DDIs reviewed  - continue Biktarvy  1 tab po daily  - labs today  - fu in 4 months   # STD Screening  - no acute concerns  - Urine, oral and anal GC + RPR  # Immunization  - Hepatitis B #1 toda  # Health maintenance - Follows outside dental clinic  Patient's labs were reviewed as well as his previous records. Patients questions were addressed and answered. Safe sex counseling done.  I spent 30 minutes involved in face-to-face and non-face-to-face activities for this patient on the day of the visit. Professional time spent includes the following activities: Preparing to see the patient (review of tests), Performing a medically appropriate examination and evaluation , Ordering labs and vaccine, Documenting clinical information in the EMR, Independently interpreting results (not separately reported), Communicating results to the patient, Counseling and educating the patient.  Of note, portions of this note may have been created with voice recognition software. While this note has been edited for accuracy, occasional wrong-word or 'sound-a-like' substitutions may have occurred due  to the inherent limitations of voice recognition software.   Electronically signed by:  Annalee Orem, MD Infectious Disease Physician [facility] [address] [place], [place] [address] Phone: [phone]  Fax: [phone]

## 2024-04-11 LAB — C. TRACHOMATIS/N. GONORRHOEAE RNA
C. trachomatis RNA, TMA: NOT DETECTED
N. gonorrhoeae RNA, TMA: NOT DETECTED

## 2024-04-11 LAB — CT/NG RNA, TMA RECTAL
Chlamydia Trachomatis RNA: DETECTED — AB
Neisseria Gonorrhoeae RNA: NOT DETECTED

## 2024-04-11 LAB — GC/CHLAMYDIA PROBE, AMP (THROAT)
Chlamydia trachomatis RNA: NOT DETECTED
Neisseria gonorrhoeae RNA: NOT DETECTED

## 2024-04-14 ENCOUNTER — Other Ambulatory Visit (HOSPITAL_COMMUNITY): Payer: Self-pay

## 2024-04-14 ENCOUNTER — Other Ambulatory Visit: Payer: Self-pay | Admitting: Infectious Diseases

## 2024-04-14 ENCOUNTER — Ambulatory Visit: Payer: Self-pay | Admitting: Infectious Diseases

## 2024-04-14 MED ORDER — DOXYCYCLINE HYCLATE 100 MG PO TABS
100.0000 mg | ORAL_TABLET | Freq: Two times a day (BID) | ORAL | 0 refills | Status: AC
  Filled 2024-04-14 – 2024-04-15 (×2): qty 14, 7d supply, fill #0

## 2024-04-15 ENCOUNTER — Other Ambulatory Visit (HOSPITAL_COMMUNITY): Payer: Self-pay

## 2024-04-15 ENCOUNTER — Other Ambulatory Visit: Payer: Self-pay

## 2024-04-16 LAB — COMPREHENSIVE METABOLIC PANEL WITH GFR
AG Ratio: 1.5 (calc) (ref 1.0–2.5)
ALT: 33 U/L (ref 9–46)
AST: 32 U/L (ref 10–40)
Albumin: 4.8 g/dL (ref 3.6–5.1)
Alkaline phosphatase (APISO): 104 U/L (ref 36–130)
BUN: 8 mg/dL (ref 7–25)
CO2: 24 mmol/L (ref 20–32)
Calcium: 9.8 mg/dL (ref 8.6–10.3)
Chloride: 104 mmol/L (ref 98–110)
Creat: 0.98 mg/dL (ref 0.60–1.24)
Globulin: 3.3 g/dL (ref 1.9–3.7)
Glucose, Bld: 110 mg/dL — ABNORMAL HIGH (ref 65–99)
Potassium: 3.9 mmol/L (ref 3.5–5.3)
Sodium: 138 mmol/L (ref 135–146)
Total Bilirubin: 0.7 mg/dL (ref 0.2–1.2)
Total Protein: 8.1 g/dL (ref 6.1–8.1)
eGFR: 110 mL/min/1.73m2 (ref >=60)

## 2024-04-16 LAB — HIV RNA, RTPCR W/R GT (RTI, PI,INT)
HIV 1 RNA Quant: NOT DETECTED {copies}/mL
HIV-1 RNA Quant, Log: NOT DETECTED {Log_copies}/mL

## 2024-04-16 LAB — RPR TITER: RPR Titer: 1:2 {titer} — ABNORMAL HIGH

## 2024-04-16 LAB — T-HELPER CELLS (CD4) COUNT (NOT AT ARMC)
Absolute CD4: 865 {cells}/uL (ref 490–1740)
CD4 T Helper %: 22 % — ABNORMAL LOW (ref 30–61)
Total lymphocyte count: 3915 {cells}/uL — ABNORMAL HIGH (ref 850–3900)

## 2024-04-16 LAB — T PALLIDUM AB: T Pallidum Abs: POSITIVE — AB

## 2024-04-16 LAB — SYPHILIS: RPR W/REFLEX TO RPR TITER AND TREPONEMAL ANTIBODIES, TRADITIONAL SCREENING AND DIAGNOSIS ALGORITHM: RPR Ser Ql: REACTIVE — AB

## 2024-05-08 ENCOUNTER — Other Ambulatory Visit: Payer: Self-pay

## 2024-05-08 NOTE — Progress Notes (Signed)
 Specialty Pharmacy Refill Coordination Note  Randy Armstrong is a 23 y.o. male contacted today regarding refills of specialty medication(s) Bictegravir-Emtricitab-Tenofov (Biktarvy )   Patient requested Delivery   Delivery date: 05/13/24   Ver[facility]: [facility]   Medication will be filled on: 05/13/24

## 2024-05-13 ENCOUNTER — Other Ambulatory Visit: Payer: Self-pay

## 2024-06-05 ENCOUNTER — Other Ambulatory Visit: Payer: Self-pay

## 2024-06-05 NOTE — Progress Notes (Signed)
 Specialty Pharmacy Refill Coordination Note  Randy Armstrong is a 24 y.o. male contacted today regarding refills of specialty medication(s) Bictegravir-Emtricitab-Tenofov (Biktarvy )   Patient requested (Patient-Rptd) Delivery   Delivery date: No data recorded  Verified address: (Patient-Rptd) [address] [place] [address].   Medication will be filled on: 06/12/24

## 2024-06-07 ENCOUNTER — Other Ambulatory Visit: Payer: Self-pay

## 2024-08-08 ENCOUNTER — Ambulatory Visit: Admitting: Infectious Diseases
